# Patient Record
Sex: Female | Born: 1955 | Race: White | Hispanic: No | Marital: Married | Smoking: Former smoker
Health system: Southern US, Community
[De-identification: ages and names within clinical notes are randomized; demographics above are authoritative.]

## PROBLEM LIST (undated history)

## (undated) DIAGNOSIS — R1310 Dysphagia, unspecified: Secondary | ICD-10-CM

## (undated) DIAGNOSIS — F32A Depression, unspecified: Secondary | ICD-10-CM

## (undated) DIAGNOSIS — R202 Paresthesia of skin: Secondary | ICD-10-CM

## (undated) DIAGNOSIS — T884XXA Failed or difficult intubation, initial encounter: Secondary | ICD-10-CM

## (undated) DIAGNOSIS — K219 Gastro-esophageal reflux disease without esophagitis: Secondary | ICD-10-CM

## (undated) DIAGNOSIS — R112 Nausea with vomiting, unspecified: Secondary | ICD-10-CM

## (undated) DIAGNOSIS — R29898 Other symptoms and signs involving the musculoskeletal system: Secondary | ICD-10-CM

## (undated) DIAGNOSIS — I059 Rheumatic mitral valve disease, unspecified: Secondary | ICD-10-CM

## (undated) DIAGNOSIS — R519 Headache, unspecified: Secondary | ICD-10-CM

## (undated) DIAGNOSIS — M1990 Unspecified osteoarthritis, unspecified site: Secondary | ICD-10-CM

## (undated) DIAGNOSIS — R079 Chest pain, unspecified: Secondary | ICD-10-CM

## (undated) DIAGNOSIS — R011 Cardiac murmur, unspecified: Secondary | ICD-10-CM

## (undated) DIAGNOSIS — Z8489 Family history of other specified conditions: Secondary | ICD-10-CM

## (undated) DIAGNOSIS — G589 Mononeuropathy, unspecified: Secondary | ICD-10-CM

## (undated) DIAGNOSIS — R002 Palpitations: Secondary | ICD-10-CM

## (undated) DIAGNOSIS — F329 Major depressive disorder, single episode, unspecified: Secondary | ICD-10-CM

## (undated) DIAGNOSIS — K3184 Gastroparesis: Secondary | ICD-10-CM

## (undated) DIAGNOSIS — F419 Anxiety disorder, unspecified: Secondary | ICD-10-CM

## (undated) DIAGNOSIS — Z9889 Other specified postprocedural states: Secondary | ICD-10-CM

## (undated) DIAGNOSIS — M797 Fibromyalgia: Secondary | ICD-10-CM

## (undated) DIAGNOSIS — G43909 Migraine, unspecified, not intractable, without status migrainosus: Secondary | ICD-10-CM

## (undated) DIAGNOSIS — T753XXA Motion sickness, initial encounter: Secondary | ICD-10-CM

## (undated) DIAGNOSIS — M7580 Other shoulder lesions, unspecified shoulder: Secondary | ICD-10-CM

## (undated) DIAGNOSIS — G709 Myoneural disorder, unspecified: Secondary | ICD-10-CM

## (undated) DIAGNOSIS — R51 Headache: Secondary | ICD-10-CM

## (undated) DIAGNOSIS — S149XXA Injury of unspecified nerves of neck, initial encounter: Secondary | ICD-10-CM

## (undated) DIAGNOSIS — E785 Hyperlipidemia, unspecified: Secondary | ICD-10-CM

## (undated) DIAGNOSIS — Z8673 Personal history of transient ischemic attack (TIA), and cerebral infarction without residual deficits: Secondary | ICD-10-CM

## (undated) DIAGNOSIS — Q064 Hydromyelia: Secondary | ICD-10-CM

## (undated) DIAGNOSIS — K589 Irritable bowel syndrome without diarrhea: Secondary | ICD-10-CM

## (undated) DIAGNOSIS — R0602 Shortness of breath: Secondary | ICD-10-CM

## (undated) HISTORY — PX: CARDIOVASCULAR STRESS TEST: SHX262

## (undated) HISTORY — PX: ABDOMINAL HYSTERECTOMY: SHX81

## (undated) HISTORY — DX: Major depressive disorder, single episode, unspecified: F32.9

## (undated) HISTORY — DX: Depression, unspecified: F32.A

## (undated) HISTORY — DX: Other shoulder lesions, unspecified shoulder: M75.80

## (undated) HISTORY — DX: Myoneural disorder, unspecified: G70.9

## (undated) HISTORY — DX: Injury of unspecified nerves of neck, initial encounter: S14.9XXA

## (undated) HISTORY — PX: COLONOSCOPY WITH ESOPHAGOGASTRODUODENOSCOPY (EGD): SHX5779

## (undated) HISTORY — DX: Mononeuropathy, unspecified: G58.9

## (undated) HISTORY — PX: ECTOPIC PREGNANCY SURGERY: SHX613

## (undated) HISTORY — DX: Hyperlipidemia, unspecified: E78.5

---

## 2004-03-03 DIAGNOSIS — T884XXA Failed or difficult intubation, initial encounter: Secondary | ICD-10-CM

## 2004-03-03 HISTORY — DX: Failed or difficult intubation, initial encounter: T88.4XXA

## 2005-04-30 ENCOUNTER — Ambulatory Visit: Payer: Self-pay

## 2006-08-25 ENCOUNTER — Emergency Department: Payer: Self-pay | Admitting: Emergency Medicine

## 2006-08-25 ENCOUNTER — Other Ambulatory Visit: Payer: Self-pay

## 2007-03-02 ENCOUNTER — Ambulatory Visit: Payer: Self-pay

## 2008-03-03 DIAGNOSIS — Z8673 Personal history of transient ischemic attack (TIA), and cerebral infarction without residual deficits: Secondary | ICD-10-CM

## 2008-03-03 HISTORY — DX: Personal history of transient ischemic attack (TIA), and cerebral infarction without residual deficits: Z86.73

## 2010-02-20 ENCOUNTER — Ambulatory Visit: Payer: Self-pay

## 2010-07-30 ENCOUNTER — Emergency Department: Payer: Self-pay | Admitting: Emergency Medicine

## 2011-06-18 ENCOUNTER — Ambulatory Visit: Payer: Self-pay | Admitting: Rheumatology

## 2011-06-23 ENCOUNTER — Other Ambulatory Visit: Payer: Self-pay | Admitting: Family Medicine

## 2011-06-23 DIAGNOSIS — G459 Transient cerebral ischemic attack, unspecified: Secondary | ICD-10-CM

## 2011-06-23 DIAGNOSIS — M545 Low back pain: Secondary | ICD-10-CM

## 2011-06-25 ENCOUNTER — Other Ambulatory Visit: Payer: Self-pay | Admitting: Family Medicine

## 2011-06-25 DIAGNOSIS — R202 Paresthesia of skin: Secondary | ICD-10-CM

## 2011-06-25 DIAGNOSIS — G459 Transient cerebral ischemic attack, unspecified: Secondary | ICD-10-CM

## 2011-06-25 DIAGNOSIS — M25511 Pain in right shoulder: Secondary | ICD-10-CM

## 2011-06-25 DIAGNOSIS — M545 Low back pain: Secondary | ICD-10-CM

## 2011-06-26 ENCOUNTER — Other Ambulatory Visit: Payer: Self-pay | Admitting: Family Medicine

## 2011-06-26 DIAGNOSIS — M25559 Pain in unspecified hip: Secondary | ICD-10-CM

## 2011-06-26 DIAGNOSIS — M545 Low back pain: Secondary | ICD-10-CM

## 2011-06-26 DIAGNOSIS — G459 Transient cerebral ischemic attack, unspecified: Secondary | ICD-10-CM

## 2011-06-26 DIAGNOSIS — M25519 Pain in unspecified shoulder: Secondary | ICD-10-CM

## 2011-06-27 ENCOUNTER — Ambulatory Visit
Admission: RE | Admit: 2011-06-27 | Discharge: 2011-06-27 | Disposition: A | Discharge status: Home / Self Care | Payer: Medicaid Other | Source: Ambulatory Visit | Attending: Family Medicine | Admitting: Family Medicine

## 2011-06-27 DIAGNOSIS — G459 Transient cerebral ischemic attack, unspecified: Secondary | ICD-10-CM

## 2011-06-27 DIAGNOSIS — M545 Low back pain: Secondary | ICD-10-CM

## 2011-06-27 MED ORDER — GADOBENATE DIMEGLUMINE 529 MG/ML IV SOLN: 9.0000 mL | Freq: Once | INTRAVENOUS | Status: AC | PRN

## 2011-07-07 ENCOUNTER — Ambulatory Visit
Admission: RE | Admit: 2011-07-07 | Discharge: 2011-07-07 | Disposition: A | Discharge status: Home / Self Care | Payer: Medicaid Other | Source: Ambulatory Visit | Attending: Family Medicine | Admitting: Family Medicine

## 2011-07-07 DIAGNOSIS — G459 Transient cerebral ischemic attack, unspecified: Secondary | ICD-10-CM

## 2011-07-07 DIAGNOSIS — M25519 Pain in unspecified shoulder: Secondary | ICD-10-CM

## 2011-07-07 DIAGNOSIS — R202 Paresthesia of skin: Secondary | ICD-10-CM

## 2011-07-07 DIAGNOSIS — M545 Low back pain: Secondary | ICD-10-CM

## 2011-07-07 DIAGNOSIS — M25559 Pain in unspecified hip: Secondary | ICD-10-CM

## 2011-07-07 DIAGNOSIS — M25511 Pain in right shoulder: Secondary | ICD-10-CM

## 2011-07-07 MED ORDER — GADOBENATE DIMEGLUMINE 529 MG/ML IV SOLN
9.0000 mL | Freq: Once | INTRAVENOUS | Status: AC | PRN
  Administered 2011-07-07: 9 mL via INTRAVENOUS

## 2011-09-10 ENCOUNTER — Encounter: Payer: Self-pay | Admitting: Physical Medicine and Rehabilitation

## 2011-09-26 ENCOUNTER — Encounter: Payer: Self-pay | Admitting: Physical Medicine and Rehabilitation

## 2011-09-26 ENCOUNTER — Encounter
Payer: Medicaid Other | Attending: Physical Medicine and Rehabilitation | Admitting: Physical Medicine and Rehabilitation

## 2011-09-26 VITALS — BP 101/69 | HR 87 | Resp 16 | Ht 62.0 in | Wt 109.0 lb

## 2011-09-26 DIAGNOSIS — M797 Fibromyalgia: Secondary | ICD-10-CM

## 2011-09-26 DIAGNOSIS — IMO0001 Reserved for inherently not codable concepts without codable children: Secondary | ICD-10-CM | POA: Insufficient documentation

## 2011-09-26 DIAGNOSIS — R269 Unspecified abnormalities of gait and mobility: Secondary | ICD-10-CM | POA: Insufficient documentation

## 2011-09-26 DIAGNOSIS — M79605 Pain in left leg: Secondary | ICD-10-CM

## 2011-09-26 DIAGNOSIS — M7062 Trochanteric bursitis, left hip: Secondary | ICD-10-CM

## 2011-09-26 DIAGNOSIS — M5137 Other intervertebral disc degeneration, lumbosacral region: Secondary | ICD-10-CM | POA: Insufficient documentation

## 2011-09-26 DIAGNOSIS — M25529 Pain in unspecified elbow: Secondary | ICD-10-CM | POA: Insufficient documentation

## 2011-09-26 DIAGNOSIS — M545 Low back pain, unspecified: Secondary | ICD-10-CM | POA: Insufficient documentation

## 2011-09-26 DIAGNOSIS — E785 Hyperlipidemia, unspecified: Secondary | ICD-10-CM | POA: Insufficient documentation

## 2011-09-26 DIAGNOSIS — M542 Cervicalgia: Secondary | ICD-10-CM | POA: Insufficient documentation

## 2011-09-26 DIAGNOSIS — M629 Disorder of muscle, unspecified: Secondary | ICD-10-CM | POA: Insufficient documentation

## 2011-09-26 DIAGNOSIS — M76899 Other specified enthesopathies of unspecified lower limb, excluding foot: Secondary | ICD-10-CM | POA: Insufficient documentation

## 2011-09-26 DIAGNOSIS — M25519 Pain in unspecified shoulder: Secondary | ICD-10-CM | POA: Insufficient documentation

## 2011-09-26 DIAGNOSIS — M51379 Other intervertebral disc degeneration, lumbosacral region without mention of lumbar back pain or lower extremity pain: Secondary | ICD-10-CM | POA: Insufficient documentation

## 2011-09-26 DIAGNOSIS — M2420 Disorder of ligament, unspecified site: Secondary | ICD-10-CM | POA: Insufficient documentation

## 2011-09-26 DIAGNOSIS — M79609 Pain in unspecified limb: Secondary | ICD-10-CM

## 2011-09-26 DIAGNOSIS — M5030 Other cervical disc degeneration, unspecified cervical region: Secondary | ICD-10-CM | POA: Insufficient documentation

## 2011-09-26 DIAGNOSIS — M7072 Other bursitis of hip, left hip: Secondary | ICD-10-CM

## 2011-09-26 DIAGNOSIS — Q064 Hydromyelia: Secondary | ICD-10-CM

## 2011-09-26 DIAGNOSIS — M25569 Pain in unspecified knee: Secondary | ICD-10-CM | POA: Insufficient documentation
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
## 2011-09-26 NOTE — Patient Instructions (Signed)
I am ordering a soft cervical collar for your neck for you to wear on a as-needed basis.  I am ordering physical therapy to help you with understanding proper body mechanics and posture.  I'm ordering physical therapy to address your tight hamstrings and trochanter bursitis on the left hip.  We can consider in interjecting this left hip to help with pain relief in the upcoming visits.  I am waiting for UDS  I agree with continuing Lyrica.  Consider neurosurgery to evaluate cysts in your neck and back.

## 2011-10-13 ENCOUNTER — Encounter: Payer: Self-pay | Admitting: Physical Medicine and Rehabilitation

## 2011-10-13 ENCOUNTER — Ambulatory Visit
Payer: Medicaid Other | Attending: Physical Medicine and Rehabilitation | Admitting: Rehabilitative and Restorative Service Providers"

## 2011-10-13 ENCOUNTER — Encounter
Payer: Medicaid Other | Attending: Physical Medicine and Rehabilitation | Admitting: Physical Medicine and Rehabilitation

## 2011-10-13 VITALS — BP 110/73 | HR 83 | Resp 14 | Ht 62.0 in | Wt 109.0 lb

## 2011-10-13 DIAGNOSIS — M25569 Pain in unspecified knee: Secondary | ICD-10-CM | POA: Insufficient documentation

## 2011-10-13 DIAGNOSIS — M25559 Pain in unspecified hip: Secondary | ICD-10-CM | POA: Insufficient documentation

## 2011-10-13 DIAGNOSIS — M25511 Pain in right shoulder: Secondary | ICD-10-CM

## 2011-10-13 DIAGNOSIS — E785 Hyperlipidemia, unspecified: Secondary | ICD-10-CM | POA: Insufficient documentation

## 2011-10-13 DIAGNOSIS — M545 Low back pain, unspecified: Secondary | ICD-10-CM

## 2011-10-13 DIAGNOSIS — M2420 Disorder of ligament, unspecified site: Secondary | ICD-10-CM | POA: Insufficient documentation

## 2011-10-13 DIAGNOSIS — M79609 Pain in unspecified limb: Secondary | ICD-10-CM | POA: Insufficient documentation

## 2011-10-13 DIAGNOSIS — IMO0001 Reserved for inherently not codable concepts without codable children: Secondary | ICD-10-CM | POA: Insufficient documentation

## 2011-10-13 DIAGNOSIS — M7072 Other bursitis of hip, left hip: Secondary | ICD-10-CM

## 2011-10-13 DIAGNOSIS — M542 Cervicalgia: Secondary | ICD-10-CM

## 2011-10-13 DIAGNOSIS — R269 Unspecified abnormalities of gait and mobility: Secondary | ICD-10-CM | POA: Insufficient documentation

## 2011-10-13 DIAGNOSIS — M76899 Other specified enthesopathies of unspecified lower limb, excluding foot: Secondary | ICD-10-CM

## 2011-10-13 DIAGNOSIS — M5137 Other intervertebral disc degeneration, lumbosacral region: Secondary | ICD-10-CM | POA: Insufficient documentation

## 2011-10-13 DIAGNOSIS — M51379 Other intervertebral disc degeneration, lumbosacral region without mention of lumbar back pain or lower extremity pain: Secondary | ICD-10-CM | POA: Insufficient documentation

## 2011-10-13 DIAGNOSIS — M25519 Pain in unspecified shoulder: Secondary | ICD-10-CM

## 2011-10-13 DIAGNOSIS — M629 Disorder of muscle, unspecified: Secondary | ICD-10-CM | POA: Insufficient documentation

## 2011-10-13 DIAGNOSIS — M25529 Pain in unspecified elbow: Secondary | ICD-10-CM | POA: Insufficient documentation

## 2011-10-13 DIAGNOSIS — M5030 Other cervical disc degeneration, unspecified cervical region: Secondary | ICD-10-CM | POA: Insufficient documentation

## 2011-10-13 DIAGNOSIS — Q064 Hydromyelia: Secondary | ICD-10-CM | POA: Insufficient documentation
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
## 2011-10-13 NOTE — Patient Instructions (Addendum)
Today we discussed your various pain complaints.  You are in physical therapy to address your left hip pain.  I also understand your right shoulder is bothering you end we are setting you up for a right shoulder injection.  You also mentioned you have some numbness and tingling in both hands and I would like to do nerve conduction studies to check for carpal, syndrome.  I discussed MRI findings in your neck and back region. I have mentioned I would like you to followup with neurosurgery to monitor this.  They may also be able to comment on your tarlov cysts.  You mentioned you've had bilateral shoulder x-rays and will bring me a copy of the previous report from these x-rays

## 2011-10-17 ENCOUNTER — Ambulatory Visit: Payer: Medicaid Other | Admitting: Physical Medicine and Rehabilitation

## 2011-10-23 ENCOUNTER — Encounter: Payer: Medicaid Other | Admitting: Physical Therapy

## 2011-10-30 ENCOUNTER — Encounter: Payer: Medicaid Other | Admitting: Physical Therapy

## 2011-10-31 ENCOUNTER — Ambulatory Visit: Payer: Medicaid Other | Admitting: Physical Medicine and Rehabilitation

## 2011-11-04 ENCOUNTER — Ambulatory Visit: Payer: Medicaid Other | Attending: Physical Medicine and Rehabilitation | Admitting: Physical Therapy

## 2011-11-04 DIAGNOSIS — IMO0001 Reserved for inherently not codable concepts without codable children: Secondary | ICD-10-CM | POA: Insufficient documentation

## 2011-11-04 DIAGNOSIS — M25559 Pain in unspecified hip: Secondary | ICD-10-CM | POA: Insufficient documentation

## 2011-11-04 DIAGNOSIS — M542 Cervicalgia: Secondary | ICD-10-CM | POA: Insufficient documentation

## 2011-11-04 DIAGNOSIS — M2560 Stiffness of unspecified joint, not elsewhere classified: Secondary | ICD-10-CM | POA: Insufficient documentation

## 2011-11-06 ENCOUNTER — Ambulatory Visit: Payer: Medicaid Other | Admitting: Physical Therapy

## 2011-11-10 ENCOUNTER — Ambulatory Visit: Payer: Medicaid Other | Admitting: Physical Medicine and Rehabilitation

## 2011-11-14 ENCOUNTER — Encounter: Payer: Self-pay | Admitting: Physical Medicine and Rehabilitation

## 2011-11-14 ENCOUNTER — Encounter
Payer: Medicaid Other | Attending: Physical Medicine and Rehabilitation | Admitting: Physical Medicine and Rehabilitation

## 2011-11-14 VITALS — BP 112/66 | HR 62 | Resp 16 | Ht 62.0 in | Wt 105.0 lb

## 2011-11-14 DIAGNOSIS — M25529 Pain in unspecified elbow: Secondary | ICD-10-CM | POA: Insufficient documentation

## 2011-11-14 DIAGNOSIS — M542 Cervicalgia: Secondary | ICD-10-CM | POA: Insufficient documentation

## 2011-11-14 DIAGNOSIS — M79609 Pain in unspecified limb: Secondary | ICD-10-CM | POA: Insufficient documentation

## 2011-11-14 DIAGNOSIS — Q064 Hydromyelia: Secondary | ICD-10-CM | POA: Insufficient documentation

## 2011-11-14 DIAGNOSIS — M2420 Disorder of ligament, unspecified site: Secondary | ICD-10-CM | POA: Insufficient documentation

## 2011-11-14 DIAGNOSIS — M25569 Pain in unspecified knee: Secondary | ICD-10-CM | POA: Insufficient documentation

## 2011-11-14 DIAGNOSIS — M545 Low back pain, unspecified: Secondary | ICD-10-CM | POA: Insufficient documentation

## 2011-11-14 DIAGNOSIS — M5030 Other cervical disc degeneration, unspecified cervical region: Secondary | ICD-10-CM | POA: Insufficient documentation

## 2011-11-14 DIAGNOSIS — R269 Unspecified abnormalities of gait and mobility: Secondary | ICD-10-CM | POA: Insufficient documentation

## 2011-11-14 DIAGNOSIS — M5137 Other intervertebral disc degeneration, lumbosacral region: Secondary | ICD-10-CM | POA: Insufficient documentation

## 2011-11-14 DIAGNOSIS — M51379 Other intervertebral disc degeneration, lumbosacral region without mention of lumbar back pain or lower extremity pain: Secondary | ICD-10-CM | POA: Insufficient documentation

## 2011-11-14 DIAGNOSIS — M629 Disorder of muscle, unspecified: Secondary | ICD-10-CM | POA: Insufficient documentation

## 2011-11-14 DIAGNOSIS — M25511 Pain in right shoulder: Secondary | ICD-10-CM

## 2011-11-14 DIAGNOSIS — M76899 Other specified enthesopathies of unspecified lower limb, excluding foot: Secondary | ICD-10-CM | POA: Insufficient documentation

## 2011-11-14 DIAGNOSIS — E785 Hyperlipidemia, unspecified: Secondary | ICD-10-CM | POA: Insufficient documentation

## 2011-11-14 DIAGNOSIS — M25519 Pain in unspecified shoulder: Secondary | ICD-10-CM | POA: Insufficient documentation

## 2011-11-14 DIAGNOSIS — IMO0001 Reserved for inherently not codable concepts without codable children: Secondary | ICD-10-CM | POA: Insufficient documentation

## 2011-11-14 NOTE — Patient Instructions (Addendum)
F/u one month       Joint Injection Care After Refer to this sheet in the next few days. These instructions provide you with information on caring for yourself after you have had a joint injection. Your caregiver also may give you more specific instructions. Your treatment has been planned according to current medical practices, but problems sometimes occur. Call your caregiver if you have any problems or questions after your procedure. After any type of joint injection, it is not uncommon to experience:  Soreness, swelling, or bruising around the injection site.   Mild numbness, tingling, or weakness around the injection site caused by the numbing medicine used before or with the injection.  It also is possible to experience the following effects associated with the specific agent after injection:  Iodine-based contrast agents:   Allergic reaction (itching, hives, widespread redness, and swelling beyond the injection site).   Corticosteroids (These effects are rare.):   Allergic reaction.   Increased blood sugar levels (If you have diabetes and you notice that your blood sugar levels have increased, notify your caregiver).   Increased blood pressure levels.   Mood swings.   Hyaluronic acid in the use of viscosupplementation.   Temporary heat or redness.   Temporary rash and itching.   Increased fluid accumulation in the injected joint.  These effects all should resolve within a day after your procedure.  HOME CARE INSTRUCTIONS  Limit yourself to light activity the day of your procedure. Avoid lifting heavy objects, bending, stooping, or twisting.   Take prescription or over-the-counter pain medication as directed by your caregiver.   You may apply ice to your injection site to reduce pain and swelling the day of your procedure. Ice may be applied 3 to 4 times:   Put ice in a plastic bag.   Place a towel between your skin and the bag.   Leave the ice on for no  longer than 15 to 20 minutes each time.  SEEK IMMEDIATE MEDICAL CARE IF:   Pain and swelling get worse rather than better or extend beyond the injection site.   Numbness does not go away.   Blood or fluid continues to leak from the injection site.   You have chest pain.   You have swelling of your face or tongue.   You have trouble breathing or you become dizzy.   You develop a fever, chills, or severe tenderness at the injection site that last longer than 1 day.  MAKE SURE YOU:  Understand these instructions.   Watch your condition.   Get help right away if you are not doing well or if you get worse.  Document Released: 10/31/2010 Document Revised: 02/06/2011 Document Reviewed: 10/31/2010 [facility] Patient Information 2012 [place], [place].

## 2011-11-14 NOTE — Progress Notes (Signed)
  Right Shoulder Pain not improved with conservative management  She  has limited range of motion in the right shoulder consistent with possible rotator cuff tendinitis. She tells me she also has known shoulder OA. She would like to be set up for right shoulder injection.    Right Shoulder injection with Ultrasound Guidance   Indication:Right Shoulder pain not relieved by medication management and other conservative care.  Informed consent was obtained after describing risks and benefits of the procedure with the patient, this includes bleeding, bruising, infection and medication side effects. The patient wishes to proceed and has given written consent. Patient was placed in a seated position. The right shoulder was marked and prepped with betadine in the subacromial area. A 25-gauge 1-1/2 inch needle was inserted into the subacromial area. After negative draw back for blood, a solution containing 1 mL of 40 mg per ML depomedrol and 3 mL of 1% lidocaine was injected. A band aid was applied. The patient tolerated the procedure well. Post procedure instructions were given.

## 2011-11-17 ENCOUNTER — Telehealth: Payer: Self-pay | Admitting: Physical Medicine and Rehabilitation

## 2011-11-17 NOTE — Telephone Encounter (Signed)
Did you want to refer this patient?

## 2011-11-17 NOTE — Telephone Encounter (Signed)
Patient following up on referral to [facility]

## 2011-11-19 ENCOUNTER — Other Ambulatory Visit: Payer: Self-pay | Admitting: Physical Medicine and Rehabilitation

## 2011-11-19 DIAGNOSIS — Q064 Hydromyelia: Secondary | ICD-10-CM

## 2011-11-19 DIAGNOSIS — M542 Cervicalgia: Secondary | ICD-10-CM

## 2011-12-12 ENCOUNTER — Ambulatory Visit: Payer: Medicaid Other | Admitting: Physical Medicine and Rehabilitation

## 2011-12-17 ENCOUNTER — Encounter: Payer: Medicaid Other | Admitting: Physical Medicine and Rehabilitation

## 2012-01-14 ENCOUNTER — Ambulatory Visit: Payer: Medicaid Other | Admitting: Physical Medicine and Rehabilitation

## 2012-02-11 ENCOUNTER — Encounter: Payer: Self-pay | Admitting: Physical Medicine and Rehabilitation

## 2012-02-11 ENCOUNTER — Ambulatory Visit
Admission: RE | Admit: 2012-02-11 | Discharge: 2012-02-11 | Disposition: A | Discharge status: Home / Self Care | Payer: Medicaid Other | Source: Ambulatory Visit | Attending: Physical Medicine and Rehabilitation | Admitting: Physical Medicine and Rehabilitation

## 2012-02-11 ENCOUNTER — Other Ambulatory Visit: Payer: Self-pay | Admitting: Physical Medicine and Rehabilitation

## 2012-02-11 ENCOUNTER — Encounter
Payer: Medicaid Other | Attending: Physical Medicine and Rehabilitation | Admitting: Physical Medicine and Rehabilitation

## 2012-02-11 VITALS — BP 103/63 | HR 79 | Resp 14 | Ht 62.0 in | Wt 115.2 lb

## 2012-02-11 DIAGNOSIS — M25562 Pain in left knee: Secondary | ICD-10-CM

## 2012-02-11 DIAGNOSIS — M25552 Pain in left hip: Secondary | ICD-10-CM

## 2012-02-11 DIAGNOSIS — G8929 Other chronic pain: Secondary | ICD-10-CM

## 2012-02-11 DIAGNOSIS — M25569 Pain in unspecified knee: Secondary | ICD-10-CM | POA: Insufficient documentation

## 2012-02-11 DIAGNOSIS — M25559 Pain in unspecified hip: Secondary | ICD-10-CM | POA: Insufficient documentation
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
## 2012-02-11 NOTE — Patient Instructions (Signed)
Today I am ordering x-rays of your left hip and left knee.  I'm glad your right shoulder has improved somewhat after the injection.  I would like you to continue to do wall walking exercises as well as cotton pendulum exercises.  I will see you back in a month  Shoulder Range of Motion Exercises The shoulder is the most flexible joint in the human body. Because of this it is also the most unstable joint in the body. All ages can develop shoulder problems. Early treatment of problems is necessary for a good outcome. People react to shoulder pain by decreasing the movement of the joint. After a brief period of time, the shoulder can become "frozen". This is an almost complete loss of the ability to move the damaged shoulder. Following injuries your caregivers can give you instructions on exercises to keep your range of motion (ability to move your shoulder freely), or regain it if it has been lost.  EXERCISES EXERCISES TO MAINTAIN THE MOBILITY OF YOUR SHOULDER: Codman's Exercise or Pendulum Exercise  This exercise may be performed in a prone (face-down) lying position or standing while leaning on a chair with the opposite arm. Its purpose is to relax the muscles in your shoulder and slowly but surely increase the range of motion and to relieve pain.  Lie on your stomach close to the side edge of the bed. Let your weak arm hang over the edge of the bed. Relax your shoulder, arm and hand. Let your shoulder blade relax and drop down.  Slowly and gently swing your arm forward and back. Do not use your neck muscles; relax them. It might be easier to have someone else gently start swinging your arm.  As pain decreases, increase your swing. To start, arm swing should begin at 15 degree angles. In time and as pain lessens, move to 30-45 degree angles. Start with swinging for about 15 seconds, and work towards swinging for 3 to 5 minutes.  This exercise may also be performed in a standing/bent over  position.  Stand and hold onto a sturdy chair with your good arm. Bend forward at the waist and bend your knees slightly to help protect your back. Relax your weak arm, let it hang limp. Relax your shoulder blade and let it drop.  Keep your shoulder relaxed and use body motion to swing your arm in small circles.  Stand up tall and relax.  Repeat motion and change direction of circles.  Start with swinging for about 30 seconds, and work towards swinging for 3 to 5 minutes. STRETCHING EXERCISES:  Lift your arm out in front of you with the elbow bent at 90 degrees. Using your other arm gently pull the elbow forward and across your body.  Bend one arm behind you with the palm facing outward. Using the other arm, hold a towel or rope and reach this arm up above your head, then bend it at the elbow to move your wrist to behind your neck. Grab the free end of the towel with the hand behind your back. Gently pull the towel up with the hand behind your neck, gradually increasing the pull on the hand behind the small of your back. Then, gradually pull down with the hand behind the small of your back. This will pull the hand and arm behind your neck further. Both shoulders will have an increased range of motion with repetition of this exercise. STRENGTHENING EXERCISES:  Standing with your arm at your side and  straight out from your shoulder with the elbow bent at 90 degrees, hold onto a small weight and slowly raise your hand so it points straight up in the air. Repeat this five times to begin with, and gradually increase to ten times. Do this four times per day. As you grow stronger you can gradually increase the weight.  Repeat the above exercise, only this time using an elastic band. Start with your hand up in the air and pull down until your hand is by your side. As you grow stronger, gradually increase the amount you pull by increasing the number or size of the elastic bands. Use the same amount of  repetitions.  Standing with your hand at your side and holding onto a weight, gradually lift the hand in front of you until it is over your head. Do the same also with the hand remaining at your side and lift the hand away from your body until it is again over your head. Repeat this five times to begin with, and gradually increase to ten times. Do this four times per day. As you grow stronger you can gradually increase the weight. Document Released: 11/16/2002 Document Revised: 05/12/2011 Document Reviewed: 02/17/2005 [facility] Patient Information 2013 [place], [place].

## 2012-02-23 ENCOUNTER — Ambulatory Visit: Payer: Self-pay | Admitting: Family Medicine

## 2012-03-24 ENCOUNTER — Encounter: Payer: Medicaid Other | Admitting: Physical Medicine and Rehabilitation

## 2012-03-31 ENCOUNTER — Encounter: Payer: Medicaid Other | Admitting: Physical Medicine and Rehabilitation

## 2012-04-16 ENCOUNTER — Encounter: Payer: Medicaid Other | Admitting: Physical Medicine and Rehabilitation

## 2012-04-20 ENCOUNTER — Encounter: Payer: Self-pay | Admitting: Physical Medicine and Rehabilitation

## 2012-04-20 ENCOUNTER — Encounter
Payer: Medicaid Other | Attending: Physical Medicine and Rehabilitation | Admitting: Physical Medicine and Rehabilitation

## 2012-04-20 VITALS — BP 116/68 | HR 72 | Resp 14 | Ht 63.0 in | Wt 116.0 lb

## 2012-04-20 DIAGNOSIS — E785 Hyperlipidemia, unspecified: Secondary | ICD-10-CM | POA: Insufficient documentation

## 2012-04-20 DIAGNOSIS — N329 Bladder disorder, unspecified: Secondary | ICD-10-CM | POA: Insufficient documentation

## 2012-04-20 DIAGNOSIS — R52 Pain, unspecified: Secondary | ICD-10-CM | POA: Insufficient documentation

## 2012-04-20 DIAGNOSIS — M792 Neuralgia and neuritis, unspecified: Secondary | ICD-10-CM

## 2012-04-20 DIAGNOSIS — G4700 Insomnia, unspecified: Secondary | ICD-10-CM | POA: Insufficient documentation

## 2012-04-20 DIAGNOSIS — M7701 Medial epicondylitis, right elbow: Secondary | ICD-10-CM | POA: Insufficient documentation

## 2012-04-20 DIAGNOSIS — M7072 Other bursitis of hip, left hip: Secondary | ICD-10-CM

## 2012-04-20 DIAGNOSIS — G589 Mononeuropathy, unspecified: Secondary | ICD-10-CM | POA: Insufficient documentation

## 2012-04-20 DIAGNOSIS — G950 Syringomyelia and syringobulbia: Secondary | ICD-10-CM | POA: Insufficient documentation

## 2012-04-20 DIAGNOSIS — M76899 Other specified enthesopathies of unspecified lower limb, excluding foot: Secondary | ICD-10-CM | POA: Insufficient documentation

## 2012-04-20 DIAGNOSIS — IMO0001 Reserved for inherently not codable concepts without codable children: Secondary | ICD-10-CM | POA: Insufficient documentation

## 2012-04-20 DIAGNOSIS — M797 Fibromyalgia: Secondary | ICD-10-CM | POA: Insufficient documentation

## 2012-04-20 DIAGNOSIS — M7702 Medial epicondylitis, left elbow: Secondary | ICD-10-CM | POA: Insufficient documentation

## 2012-04-20 DIAGNOSIS — M7700 Medial epicondylitis, unspecified elbow: Secondary | ICD-10-CM

## 2012-04-20 DIAGNOSIS — IMO0002 Reserved for concepts with insufficient information to code with codable children: Secondary | ICD-10-CM

## 2012-04-20 DIAGNOSIS — M25559 Pain in unspecified hip: Secondary | ICD-10-CM

## 2012-04-20 DIAGNOSIS — G8929 Other chronic pain: Secondary | ICD-10-CM

## 2012-04-20 MED ORDER — DICLOFENAC SODIUM 1 % TD GEL: 2.0000 g | Freq: Four times a day (QID) | TRANSDERMAL | Status: DC

## 2012-04-20 NOTE — Progress Notes (Signed)
Subjective:    Patient ID: Carmen Deleon, female    DOB: May 27, 1955, 57 y.o.   MRN: [id number]  HPI The patient is a 57 year old woman with multiple pain complaints. She has a known cervical/thoracic syrinx and has seen neurosurgery who is currently following. She has a long history of fibromyalgia. She has Tarlov cysts. She is chronic shoulder pain/rotator cuff problems.  Her chief complaint today is multiple and includes new bilateral heel elbow pain. Chronic ongoing left hip thigh pain (since about 2006). Insomnia. Occasional bladder problems.  Recent hip and knee radiographs are reviewed with her and show mild degenerative changes.  She reports she had recent bone density and mammogram.  She maintains contact with primary care.   Pain Inventory Average Pain 9 Pain Right Now 9 My pain is constant, sharp, burning, dull, stabbing, tingling and aching  In the last 24 hours, has pain interfered with the following? General activity 9 Relation with others 9 Enjoyment of life 9 What TIME of day is your pain at its worst? morning, evening and night Sleep (in general) Poor  Pain is worse with: walking, bending, sitting and standing Pain improves with: rest, heat/ice and medication Relief from Meds: 5  Mobility ability to climb steps?  yes do you drive?  yes  Function disabled: date disabled 2007 I need assistance with the following:  meal prep, household duties and shopping  Neuro/Psych bladder control problems bowel control problems weakness numbness tingling trouble walking spasms depression anxiety  Prior Studies Any changes since last visit?  no  Physicians involved in your care Any changes since last visit?  no   Family History  Problem Relation Age of Onset  . Stroke Mother   . Heart disease Mother   . Cancer Mother   . Hypertension Mother   . Arthritis Mother   . Depression Mother   . Cancer Father   . Arthritis Father   . Depression Father     History   Social History  . Marital Status: Married    Spouse Name: N/A    Number of Children: N/A  . Years of Education: N/A   Social History Main Topics  . Smoking status: Former Games developer  . Smokeless tobacco: Never Used  . Alcohol Use: No  . Drug Use: None  . Sexually Active: None   Other Topics Concern  . None   Social History Narrative  . None   Past Surgical History  Procedure Laterality Date  . Abdominal hysterectomy     Past Medical History  Diagnosis Date  . Depression   . Hyperlipidemia   . Neuromuscular disorder   . AC (acromioclavicular) joint bone spurs   . Pinched nerve in neck    BP 116/68  Pulse 72  Resp 14  Ht 5\' 3"  (1.6 m)  Wt 116 lb (52.617 kg)  BMI 20.55 kg/m2  SpO2 100%     Review of Systems  HENT: Positive for neck pain.   Gastrointestinal: Positive for nausea, vomiting, diarrhea and constipation.  Musculoskeletal: Positive for back pain and gait problem.  Neurological: Positive for weakness and numbness.  Psychiatric/Behavioral: Positive for dysphoric mood. The patient is nervous/anxious.   All other systems reviewed and are negative.       Objective:   Physical Exam  The patient is a well-developed, thin woman who is in no apparent distress.  She is oriented x3 her speech is clear, affect initially depressed however as we talked she did laugh and brighten up.  Cranial nerves are grossly intact  Reflexes are 2+ in the upper extremities biceps triceps brachioradialis  Reflexes are 2+ in the lower extremities patellar tendon and Achilles tendon  No abnormal tone clonus or tremors are noted  (Patient reports taking Valium as well as baclofen earlier in the day)  Reports slightly decreased sensation left lower extremity compared to right to light touch  Manual muscle testing reveals good strength in both upper and lower extremities without obvious focal deficit. Pain complaints are noted during manual muscle  testing.  Active Wrist flexion actively increases pain along the medial epicondyle bilaterally  Coordination is intact.  Toes are downgoing no clonus is noted  Transitions from sit to stand, antalgic gait noted  Decreased range of motion lumbar spine in all planes  Tenderness noted along left lateral hip  Tenderness also noted over bilateral medial epicondyle's, full range of motion at wrist noted full range of motion at elbow noted with flexion extension as well as supination and pronation  Pain in the medial epicondyle is aggravated with forced wrist flexion             Assessment & Plan:  1.  Bilateral medial epicondylitis:   Plan: Voltaren gel to each elbow 4 times a day, icing 10-15 minutes 3 times a day, stretches as directed  2.  Left trochanteric bursitis:  Ice to left trochanter several times a day as directed  Continue home stretching program  Consider left trochanter hip injection in the upcoming weeks if still problematic  3.  Insomnia: Educational material given on sleep hygiene today.  Recommend decreased caffeine intake after noon.  Discontinue caffeinated tea in the evening.  4. Bladder problems:   Consider urology consultation to eval cause.  5.  Diffuse pain: Etiology may be multifactorial she has a history of fibromyalgia as well as syringomyelia, and tarlov cysts, contributing rolling pain unclear. Maintain contact with neurosurgeon for monitoring syrinx.  Plan: Patient has tolerated Lyrica 50 mg 3 times a day, consider increasing to 75 mg 3 times a day. She would like to address this with her PCP as this medication is currently prescribed by PCP.  On tramadol but does not like to take it.  Makes her nauseated at times  F/u one month ORT 6

## 2012-04-20 NOTE — Patient Instructions (Addendum)
F/u one month  Considering increase in lyrica to 75 mg three times per day  Considering left hip injection  Maintain contact with primary care physician  neurosurgeon per his follow up request Consider urology consult for bladder problems  Medial Epicondylitis (Golfer's Elbow) with Rehab Medial epicondylitis involves inflammation and pain around the inner (medial) portion of the elbow. This pain is caused by inflammation of the tendons in the forearm that flex (bring down) the wrist. Medial epicondylitis is also called golfer's elbow, because it is common among golfers. However, it may occur in any individual who flexes the wrist regularly. If medial epicondylitis is left untreated, it may become a chronic problem. SYMPTOMS   Pain, tenderness, or inflammation over the inner (medial) side of the elbow.  Pain or weakness with gripping activities.  Pain that increases with wrist twisting motions (using a screwdriver, playing golf, bowling). CAUSES  Medial epicondylitis is caused by inflammation of the tendons that flex the wrist. Causes of injury may include:  Chronic, repetitive stress and strain to the tendons that run from the wrist and forearm to the elbow.  Sudden strain on the forearm, including wrist snap when serving balls with racquet sports, or throwing a baseball. RISK INCREASES WITH:  Sports or occupations that require repetitive and/or strenuous forearm and wrist movements (pitching a baseball, golfing, carpentry).  Poor wrist and forearm strength and flexibility.  Failure to warm up properly before activity.  Resuming activity before healing, rehabilitation, and conditioning are complete. PREVENTION   Warm up and stretch properly before activity.  Maintain physical fitness:  Strength, flexibility, and endurance.  Cardiovascular fitness.  Wear and use properly fitted equipment.  Learn and use proper technique and have a coach correct improper  technique.  Wear a tennis elbow (counterforce) brace. PROGNOSIS  The course of this condition depends on the degree of the injury. If treated properly, acute cases (symptoms lasting less than 4 weeks) are often resolved in 2 to 6 weeks. Chronic (longer lasting cases) often resolve in 3 to 6 months, but may require physical therapy. RELATED COMPLICATIONS   Frequently recurring symptoms, resulting in a chronic problem. Properly treating the problem the first time decreases frequency of recurrence.  Chronic inflammation, scarring, and partial tendon tear, requiring surgery.  Delayed healing or resolution of symptoms. TREATMENT  Treatment first involves the use of ice and medicine, to reduce pain and inflammation. Strengthening and stretching exercises may reduce discomfort, if performed regularly. These exercises may be performed at home, if the condition is an acute injury. Chronic cases may require a referral to a physical therapist for evaluation and treatment. Your caregiver may advise a corticosteroid injection to help reduce inflammation. Rarely, surgery is needed. MEDICATION  If pain medicine is needed, nonsteroidal anti-inflammatory medicines (aspirin and ibuprofen), or other minor pain relievers (acetaminophen), are often advised.  Do not take pain medicine for 7 days before surgery.  Prescription pain relievers may be given, if your caregiver thinks they are needed. Use only as directed and only as much as you need.  Corticosteroid injections may be recommended. These injections should be reserved only for the most severe cases, because they can only be given a certain number of times. HEAT AND COLD  Cold treatment (icing) should be applied for 10 to 15 minutes every 2 to 3 hours for inflammation and pain, and immediately after activity that aggravates your symptoms. Use ice packs or an ice massage.  Heat treatment may be used before performing stretching and  strengthening  activities prescribed by your caregiver, physical therapist, or athletic trainer. Use a heat pack or a warm water soak. SEEK MEDICAL CARE IF: Symptoms get worse or do not improve in 2 weeks, despite treatment. EXERCISES  RANGE OF MOTION (ROM) AND STRETCHING EXERCISES - Epicondylitis, Medial (Golfer's Elbow) These exercises may help you when beginning to rehabilitate your injury. Your symptoms may go away with or without further involvement from your physician, physical therapist or athletic trainer. While completing these exercises, remember:   Restoring tissue flexibility helps normal motion to return to the joints. This allows healthier, less painful movement and activity.  An effective stretch should be held for at least 30 seconds.  A stretch should never be painful. You should only feel a gentle lengthening or release in the stretched tissue. RANGE OF MOTION  Wrist Flexion, Active-Assisted  Extend your right / left elbow with your fingers pointing down.*  Gently pull the back of your hand towards you, until you feel a gentle stretch on the top of your forearm.  Hold this position for __________ seconds. Repeat __________ times. Complete this exercise __________ times per day.  *If directed by your physician, physical therapist or athletic trainer, complete this stretch with your elbow bent, rather than extended. RANGE OF MOTION  Wrist Extension, Active-Assisted  Extend your right / left elbow and turn your palm upwards.*  Gently pull your palm and fingertips back, so your wrist extends and your fingers point more toward the ground.  You should feel a gentle stretch on the inside of your forearm.  Hold this position for __________ seconds. Repeat __________ times. Complete this exercise __________ times per day. *If directed by your physician, physical therapist or athletic trainer, complete this stretch with your elbow bent, rather than extended. STRETCH  Wrist Extension    Place your right / left fingertips on a tabletop leaving your elbow slightly bent. Your fingers should point backwards.  Gently press your fingers and palm down onto the table, by straightening your elbow. You should feel a stretch on the inside of your forearm.  Hold this position for __________ seconds. Repeat __________ times. Complete this stretch __________ times per day.  STRENGTHENING EXERCISES - Epicondylitis, Medial (Golfer's Elbow) These exercises may help you when beginning to rehabilitate your injury. They may resolve your symptoms with or without further involvement from your physician, physical therapist or athletic trainer. While completing these exercises, remember:   Muscles can gain both the endurance and the strength needed for everyday activities through controlled exercises.  Complete these exercises as instructed by your physician, physical therapist or athletic trainer. Increase the resistance and repetitions only as guided.  You may experience muscle soreness or fatigue, but the pain or discomfort you are trying to eliminate should never worsen during these exercises. If this pain does get worse, stop and make sure you are following the directions exactly. If the pain is still present after adjustments, discontinue the exercise until you can discuss the trouble with your caregiver. STRENGTH Wrist Flexors  Sit with your right / left forearm palm-up, and fully supported on a table or countertop. Your elbow should be resting below the height of your shoulder. Allow your wrist to extend over the edge of the surface.  Loosely holding a __________ weight, or a piece of rubber exercise band or tubing, slowly curl your hand up toward your forearm.  Hold this position for __________ seconds. Slowly lower the wrist back to the starting position in a  controlled manner. Repeat __________ times. Complete this exercise __________ times per day.  STRENGTH  Wrist Extensors  Sit  with your right / left forearm palm-down and fully supported. Your elbow should be resting below the height of your shoulder. Allow your wrist to extend over the edge of the surface.  Loosely holding a __________ weight, or a piece of rubber exercise band or tubing, slowly curl your hand up toward your forearm.  Hold this position for __________ seconds. Slowly lower the wrist back to the starting position in a controlled manner. Repeat __________ times. Complete this exercise __________ times per day.  STRENGTH - Ulnar Deviators  Stand with a ____________________ weight in your right / left hand, or sit while holding a rubber exercise band or tubing, with your healthy arm supported on a table or countertop.  Move your wrist so that your pinkie travels toward your forearm and your thumb moves away from your forearm.  Hold this position for __________ seconds and then slowly lower the wrist back to the starting position. Repeat __________ times. Complete this exercise __________ times per day STRENGTH - Grip   Grasp a tennis ball, a dense sponge, or a large, rolled sock in your hand.  Squeeze as hard as you can, without increasing any pain.  Hold this position for __________ seconds. Release your grip slowly. Repeat __________ times. Complete this exercise __________ times per day.  STRENGTH  Forearm Supinators   Sit with your right / left forearm supported on a table, keeping your elbow below shoulder height. Rest your hand over the edge, palm down.  Gently grip a hammer or a soup ladle.  Without moving your elbow, slowly turn your palm and hand upward to a "thumbs-up" position.  Hold this position for __________ seconds. Slowly return to the starting position. Repeat __________ times. Complete this exercise __________ times per day.  STRENGTH  Forearm Pronators  Sit with your right / left forearm supported on a table, keeping your elbow below shoulder height. Rest your hand over  the edge, palm up.  Gently grip a hammer or a soup ladle.  Without moving your elbow, slowly turn your palm and hand upward to a "thumbs-up" position.  Hold this position for __________ seconds. Slowly return to the starting position. Repeat __________ times. Complete this exercise __________ times per day.  Document Released: 02/17/2005 Document Revised: 05/12/2011 Document Reviewed: 06/01/2008 [facility] Patient Information 2013 [place], [place].

## 2012-04-22 ENCOUNTER — Telehealth: Payer: Self-pay

## 2012-04-22 MED ORDER — DICLOFENAC SODIUM 1 % TD GEL: 2.0000 g | Freq: Four times a day (QID) | TRANSDERMAL | Status: DC

## 2012-04-22 NOTE — Telephone Encounter (Signed)
Patient called to say her volteran gel was not sent to the right pharmacy.  She would like it called into [facility] [phone].  This was called in.  Patient aware.

## 2012-05-12 ENCOUNTER — Encounter
Payer: Medicaid Other | Attending: Physical Medicine and Rehabilitation | Admitting: Physical Medicine and Rehabilitation

## 2012-05-12 ENCOUNTER — Encounter: Payer: Self-pay | Admitting: Physical Medicine and Rehabilitation

## 2012-05-12 VITALS — BP 113/59 | HR 70 | Resp 14 | Ht 63.0 in | Wt 115.4 lb

## 2012-05-12 DIAGNOSIS — M25559 Pain in unspecified hip: Secondary | ICD-10-CM | POA: Insufficient documentation

## 2012-05-12 DIAGNOSIS — M25569 Pain in unspecified knee: Secondary | ICD-10-CM | POA: Insufficient documentation

## 2012-05-12 DIAGNOSIS — M797 Fibromyalgia: Secondary | ICD-10-CM

## 2012-05-12 DIAGNOSIS — M542 Cervicalgia: Secondary | ICD-10-CM

## 2012-05-12 DIAGNOSIS — G8929 Other chronic pain: Secondary | ICD-10-CM | POA: Insufficient documentation

## 2012-05-12 DIAGNOSIS — IMO0001 Reserved for inherently not codable concepts without codable children: Secondary | ICD-10-CM

## 2012-05-12 NOTE — Progress Notes (Signed)
Subjective:    Patient ID: Carmen Deleon, female    DOB: 30-Apr-1955, 57 y.o.   MRN: [id number]  HPI   The patient is a 57 year old woman with multiple pain complaints. She has a known cervical/thoracic syrinx and has seen neurosurgery who is currently following. She has a long history of fibromyalgia. She has Tarlov cysts. She is chronic shoulder pain/rotator cuff problems.    Her chief complaint today is diffuse body pain. Chronic ongoing left hip thigh pain (since about 2006). Insomnia. Occasional bladder problems. Last problems with bladder were more than 2 months ago for a total of 3 episodes. For the last 2 months she has not had any further bladder problems.  Recent hip and knee radiographs are reviewed with her and show mild degenerative changes.  She reports she had recent bone density and mammogram.  She maintains contact with primary care.      Pain Inventory Average Pain 8 Pain Right Now 8 My pain is constant, dull, stabbing, tingling and aching  In the last 24 hours, has pain interfered with the following? General activity 8 Relation with others 8 Enjoyment of life 8 What TIME of day is your pain at its worst? all Sleep (in general) Poor  Pain is worse with: walking, bending, sitting and standing Pain improves with: rest, heat/ice, medication and injections Relief from Meds: 6  Mobility walk without assistance how many minutes can you walk? 5-10 ability to climb steps?  yes do you drive?  yes  Function disabled: date disabled 2007 I need assistance with the following:  dressing, bathing, household duties and shopping  Neuro/Psych bladder control problems weakness numbness tremor tingling trouble walking spasms depression anxiety  Prior Studies Any changes since last visit?  no  Physicians involved in your care Any changes since last visit?  no   Family History  Problem Relation Age of Onset  . Stroke Mother   . Heart disease Mother   .  Cancer Mother   . Hypertension Mother   . Arthritis Mother   . Depression Mother   . Cancer Father   . Arthritis Father   . Depression Father    History   Social History  . Marital Status: Married    Spouse Name: N/A    Number of Children: N/A  . Years of Education: N/A   Social History Main Topics  . Smoking status: Former Games developer  . Smokeless tobacco: Never Used  . Alcohol Use: No  . Drug Use: None  . Sexually Active: None   Other Topics Concern  . None   Social History Narrative  . None   Past Surgical History  Procedure Laterality Date  . Abdominal hysterectomy     Past Medical History  Diagnosis Date  . Depression   . Hyperlipidemia   . Neuromuscular disorder   . AC (acromioclavicular) joint bone spurs   . Pinched nerve in neck    BP 113/59  Pulse 70  Resp 14  Ht 5\' 3"  (1.6 m)  Wt 115 lb 6.4 oz (52.345 kg)  BMI 20.45 kg/m2  SpO2 100%    Review of Systems  HENT: Positive for neck pain.   Respiratory: Positive for shortness of breath.   Cardiovascular: Positive for leg swelling.  Gastrointestinal: Positive for nausea, vomiting, abdominal pain, diarrhea and constipation.       IBS  Musculoskeletal: Positive for back pain and gait problem.  Neurological: Positive for tremors, weakness and numbness.  Tingling  Psychiatric/Behavioral: Positive for dysphoric mood. The patient is nervous/anxious.   All other systems reviewed and are negative.       Objective:   Physical Exam  The patient is a well-developed, thin woman who is in no apparent distress.  She is oriented x3 her speech is clear, affect initially depressed however as we talked she did laugh and brighten up.  Cranial nerves are grossly intact  Reflexes are 2+ in the upper extremities biceps triceps brachioradialis  Reflexes are 2+ in the lower extremities patellar tendon and Achilles tendon  No abnormal tone clonus or tremors are noted  (Patient reports taking Valium as well as  baclofen earlier in the day)  Reports slightly decreased sensation left lower extremity compared to right to light touch  Manual muscle testing reveals good strength in both upper and lower extremities without obvious focal deficit. Pain complaints are noted during manual muscle testing.  Active Wrist flexion no longer increases pain along the medial epicondyle bilaterally  Coordination is intact.   Toes are downgoing no clonus is noted  Transitions from sit to stand, antalgic gait noted  Decreased range of motion lumbar spine in all planes  Tenderness noted along left lateral hip  Tenderness also noted over bilateral medial epicondyle's, full range of motion at wrist noted full range of motion at elbow noted with flexion extension as well as supination and pronation  Pain in the medial epicondyle is aggravated with forced wrist flexion   Cervical range of motion is limited in extension. Rotation right and left are near normal.  Right shoulder range of motion limited to 90. Left shoulder range of motion 110 of abduction.         Assessment & Plan:  1. Bilateral medial epicondylitis: Improved Continue Voltaren gel to each elbow 4 times a day, icing 10-15 minutes 3 times a day, stretches as directed  2. Left trochanteric bursitis:  Ice to left trochanter several times a day as directed  Continue home stretching program  Consider left trochanter hip injection in the upcoming weeks if still problematic  Hip stretches were reviewed with her today, recommend aquatic therapy and daily walking.  4. Bladder problems: resolved, last problem was 2 months ago.  5. Diffuse pain: Etiology may be multifactorial she has a history of fibromyalgia as well as syringomyelia, and tarlov cysts, contributing rolling pain unclear. Maintain contact with neurosurgeon for monitoring syrinx.(She has an appointment with neurosurgeon Dr. Lovell Sheehan in April) Plan: Patient has tolerated Lyrica 50 mg 3 times a day,  consider increasing to 75 mg 3 times a day.(He is not interested increasing dose of Lyrica at this time)   She would like to address this with her PCP as this medication is currently prescribed by PCP.   On tramadol but does not like to take it. Makes her nauseated at times, he is currently tolerating a half a tablet at noon each day. She is currently satisfied with her pain medications at this time. F/u 3 months ORT 6

## 2012-05-12 NOTE — Patient Instructions (Signed)
Try to walk 3 times per day 5-10 minutes.   I am also encouraging you to consider a fibromyalgia aquatic program 2-3 times per week.  Keep appt with neurosurgeon in April.  Continue to use voltaren gel as prescribed.  Follow up in 3 months

## 2012-05-25 ENCOUNTER — Telehealth: Payer: Self-pay

## 2012-05-25 NOTE — Telephone Encounter (Signed)
Patient called wanting to talk to someone concerning something that is in her records.  Please call.

## 2012-05-26 NOTE — Telephone Encounter (Signed)
Patient says she in not able to pay for aquatic therapy at this time.  She is trying to walk as much as possible but it is difficult.  She just wanted to make [place] aware.

## 2012-07-07 ENCOUNTER — Encounter
Payer: Medicaid Other | Attending: Physical Medicine and Rehabilitation | Admitting: Physical Medicine and Rehabilitation

## 2012-07-07 ENCOUNTER — Encounter: Payer: Self-pay | Admitting: Physical Medicine and Rehabilitation

## 2012-07-07 VITALS — BP 100/61 | HR 80 | Resp 14 | Ht 64.0 in | Wt 116.0 lb

## 2012-07-07 DIAGNOSIS — M7062 Trochanteric bursitis, left hip: Secondary | ICD-10-CM

## 2012-07-07 DIAGNOSIS — M25511 Pain in right shoulder: Secondary | ICD-10-CM

## 2012-07-07 DIAGNOSIS — M25519 Pain in unspecified shoulder: Secondary | ICD-10-CM

## 2012-07-07 DIAGNOSIS — IMO0001 Reserved for inherently not codable concepts without codable children: Secondary | ICD-10-CM

## 2012-07-07 DIAGNOSIS — M25569 Pain in unspecified knee: Secondary | ICD-10-CM | POA: Insufficient documentation

## 2012-07-07 DIAGNOSIS — M7702 Medial epicondylitis, left elbow: Secondary | ICD-10-CM

## 2012-07-07 DIAGNOSIS — IMO0002 Reserved for concepts with insufficient information to code with codable children: Secondary | ICD-10-CM

## 2012-07-07 DIAGNOSIS — G950 Syringomyelia and syringobulbia: Secondary | ICD-10-CM

## 2012-07-07 DIAGNOSIS — M7700 Medial epicondylitis, unspecified elbow: Secondary | ICD-10-CM

## 2012-07-07 DIAGNOSIS — M797 Fibromyalgia: Secondary | ICD-10-CM

## 2012-07-07 DIAGNOSIS — G8929 Other chronic pain: Secondary | ICD-10-CM | POA: Insufficient documentation

## 2012-07-07 DIAGNOSIS — M76899 Other specified enthesopathies of unspecified lower limb, excluding foot: Secondary | ICD-10-CM

## 2012-07-07 DIAGNOSIS — M792 Neuralgia and neuritis, unspecified: Secondary | ICD-10-CM

## 2012-07-07 DIAGNOSIS — M25559 Pain in unspecified hip: Secondary | ICD-10-CM | POA: Insufficient documentation

## 2012-07-07 NOTE — Progress Notes (Signed)
Subjective:    Patient ID: Carmen Deleon, female    DOB: February 27, 1956, 57 y.o.   MRN: [id number]  HPI   The patient is a 57 year old woman with multiple pain complaints. She has a known cervical/thoracic syrinx and has seen neurosurgery ( Dr. Lorinda Creed) who is currently following. She has a long history of fibromyalgia. She has Tarlov cysts. She is chronic shoulder pain/rotator cuff problems.   Her chief complaint today is diffuse body pain. Chronic ongoing left hip thigh and leg pain (since about 2006). Insomnia. Occasional bladder problems. Last problems with bladder were more than 3 months ago for a total of 3 episodes. For the last 3 months she has not had any further bladder problems.  Recent hip and knee radiographs are reviewed with her and show mild degenerative changes.    Other complaints today include ongoing left shoulder pain. She had an injection a couple months ago into the right shoulder which is helped her significantly.  She also complains of lateral left hip pain, this pain radiates laterally along the leg to the knee. She also has some chronic left leg pain which radiates posteriorly  all the way down the left leg.  She maintains contact with primary care.       Pain Inventory Average Pain 9 Pain Right Now 8 My pain is constant, dull, stabbing and aching  In the last 24 hours, has pain interfered with the following? General activity 9 Relation with others 9 Enjoyment of life 9 What TIME of day is your pain at its worst? morning evening and night Sleep (in general) Poor  Pain is worse with: walking, bending, sitting and standing Pain improves with: heat/ice, medication and injections Relief from Meds: 6  Mobility walk without assistance how many minutes can you walk? 5-10 ability to climb steps?  yes do you drive?  yes  Function disabled: date disabled . I need assistance with the following:  household duties and shopping  Neuro/Psych bowel control  problems weakness numbness tremor trouble walking spasms dizziness confusion depression anxiety  Prior Studies Any changes since last visit?  no  Physicians involved in your care Any changes since last visit?  no   Family History  Problem Relation Age of Onset  . Stroke Mother   . Heart disease Mother   . Cancer Mother   . Hypertension Mother   . Arthritis Mother   . Depression Mother   . Cancer Father   . Arthritis Father   . Depression Father    History   Social History  . Marital Status: Married    Spouse Name: N/A    Number of Children: N/A  . Years of Education: N/A   Social History Main Topics  . Smoking status: Former Games developer  . Smokeless tobacco: Never Used  . Alcohol Use: No  . Drug Use: None  . Sexually Active: None   Other Topics Concern  . None   Social History Narrative  . None   Past Surgical History  Procedure Laterality Date  . Abdominal hysterectomy     Past Medical History  Diagnosis Date  . Depression   . Hyperlipidemia   . Neuromuscular disorder   . AC (acromioclavicular) joint bone spurs   . Pinched nerve in neck    BP 100/61  Pulse 80  Resp 14  Ht 5\' 4"  (1.626 m)  Wt 116 lb (52.617 kg)  BMI 19.9 kg/m2  SpO2 99%    Review of Systems  HENT: Positive  for neck pain.   Cardiovascular: Positive for leg swelling.  Gastrointestinal: Positive for abdominal pain and constipation.       Bowel Control Problems  Musculoskeletal: Positive for back pain.  Neurological: Positive for dizziness, tremors, weakness and numbness.  Psychiatric/Behavioral: Positive for dysphoric mood. The patient is nervous/anxious.   All other systems reviewed and are negative.       Objective:   Physical Exam The patient is a well-developed, thin woman who is in no apparent distress.  She is oriented x3 her speech is clear, affect over all bright and she did laugh some as well.  Cranial nerves are grossly intact  Reflexes are 2+ in the upper  extremities biceps triceps brachioradialis  Reflexes are 2+ in the lower extremities patellar tendon and Achilles tendon  No abnormal tone clonus or tremors are noted   Reports slightly decreased sensation left lower extremity compared to right to light touch and pp. Manual muscle testing reveals good strength in both upper and lower extremities without obvious focal deficit. Pain complaints are noted during manual muscle testing.  Active left Wrist flexion  increases pain along the medial epicondyle bilaterally L greater than R. Coordination is intact.  Toes are downgoing no clonus is noted  Transitions from sit to stand, antalgic gait noted  Decreased range of motion lumbar spine in all planes   Tenderness noted along left lateral hip   Tenderness also noted over bilateral medial epicondyle's, full range of motion at wrist noted full range of motion at elbow noted with flexion extension as well as supination and pronation  Pain in the medial epicondyle is aggravated with forced wrist flexion  Cervical range of motion is limited in extension. Rotation right and left are near normal.  Right shoulder range of motion 120 degrees which has improved (after injection) was less than 90. Left shoulder range of motion 80 of abduction.         Assessment & Plan:  1. Bilateral medial epicondylitis: Improved  Continue Voltaren gel to each elbow 4 times a day, icing 10-15 minutes 3 times a day, stretches as directed  2. Left trochanteric bursitis:  Ice to left trochanter several times a day as directed  Continue home stretching program  Consider left trochanter hip injection  if still problematic  Hip stretches were reviewed with her today, recommend aquatic therapy and daily walking.   3. Left Shoulder Pain may be multifactorial, rotator cuff tendinitis/bursitis/cervical radiculopathy. Injection to left shoulder improved range of motion and pain substantially recommend left shoulder  injection. Patient would like to think about this.  4. Right shoulder pain and decreased ROM significantly improved after injection.  4. Bladder problems- stable, no further episodes of incontinance resolved, last problem was 3 months ago. She also talked to PCP also, wants to hold off on further W/u with urologist.  5. Diffuse pain: Etiology may be multifactorial she has a history of fibromyalgia as well as syringomyelia, and tarlov cysts, radiculopathy, contributing to her pain unclear. Maintain contact with neurosurgeon for monitoring syrinx and possible surgical intervention for neck pain and radiculopathy.(She has an appointment with neurosurgeon Dr. Lovell Sheehan on PRN basis at this time)  Plan: Patient has tolerated Lyrica 50 mg 3 times a day, consider increasing to 75 mg 3 times a day.(She is not interested increasing dose of Lyrica at this time, she believe she did not do well at higher doses)  She addressed this with her PCP and they decided to keep current dose.  On tramadol but does not like to take it.  Makes her nauseated at times, she is currently tolerating a half a tablet at noon each day.  has only used it  3-4 times in last month. She is currently satisfied with her pain medications at this time.  Baclofen is used prn---3 times in the last month.  Recommend f/u with Dr. Lovell Sheehan per his recommendation for syrinx F/u 3 months  ORT 6

## 2012-07-07 NOTE — Patient Instructions (Signed)
Consider left shoulder injection   Consider left trochanter(hip) injection  Continue to use your voltaren gel on your left elbow.  Maintain contact with Dr. Lovell Sheehan to monitor your spinal cyst.    Insomnia Insomnia is frequent trouble falling and/or staying asleep. Insomnia can be a long term problem or a short term problem. Both are common. Insomnia can be a short term problem when the wakefulness is related to a certain stress or worry. Long term insomnia is often related to ongoing stress during waking hours and/or poor sleeping habits. Overtime, sleep deprivation itself can make the problem worse. Every little thing feels more severe because you are overtired and your ability to cope is decreased. CAUSES   Stress, anxiety, and depression.  Poor sleeping habits.  Distractions such as TV in the bedroom.  Naps close to bedtime.  Engaging in emotionally charged conversations before bed.  Technical reading before sleep.  Alcohol and other sedatives. They may make the problem worse. They can hurt normal sleep patterns and normal dream activity.  Stimulants such as caffeine for several hours prior to bedtime.  Pain syndromes and shortness of breath can cause insomnia.  Exercise late at night.  Changing time zones may cause sleeping problems (jet lag). It is sometimes helpful to have someone observe your sleeping patterns. They should look for periods of not breathing during the night (sleep apnea). They should also look to see how long those periods last. If you live alone or observers are uncertain, you can also be observed at a sleep clinic where your sleep patterns will be professionally monitored. Sleep apnea requires a checkup and treatment. Give your caregivers your medical history. Give your caregivers observations your family has made about your sleep.  SYMPTOMS   Not feeling rested in the morning.  Anxiety and restlessness at bedtime.  Difficulty falling and staying  asleep. TREATMENT   Your caregiver may prescribe treatment for an underlying medical disorders. Your caregiver can give advice or help if you are using alcohol or other drugs for self-medication. Treatment of underlying problems will usually eliminate insomnia problems.  Medications can be prescribed for short time use. They are generally not recommended for lengthy use.  Over-the-counter sleep medicines are not recommended for lengthy use. They can be habit forming.  You can promote easier sleeping by making lifestyle changes such as:  Using relaxation techniques that help with breathing and reduce muscle tension.  Exercising earlier in the day.  Changing your diet and the time of your last meal. No night time snacks.  Establish a regular time to go to bed.  Counseling can help with stressful problems and worry.  Soothing music and white noise may be helpful if there are background noises you cannot remove.  Stop tedious detailed work at least one hour before bedtime. HOME CARE INSTRUCTIONS   Keep a diary. Inform your caregiver about your progress. This includes any medication side effects. See your caregiver regularly. Take note of:  Times when you are asleep.  Times when you are awake during the night.  The quality of your sleep.  How you feel the next day. This information will help your caregiver care for you.  Get out of bed if you are still awake after 15 minutes. Read or do some quiet activity. Keep the lights down. Wait until you feel sleepy and go back to bed.  Keep regular sleeping and waking hours. Avoid naps.  Exercise regularly.  Avoid distractions at bedtime. Distractions include watching television  or engaging in any intense or detailed activity like attempting to balance the household checkbook.  Develop a bedtime ritual. Keep a familiar routine of bathing, brushing your teeth, climbing into bed at the same time each night, listening to soothing music.  Routines increase the success of falling to sleep faster.  Use relaxation techniques. This can be using breathing and muscle tension release routines. It can also include visualizing peaceful scenes. You can also help control troubling or intruding thoughts by keeping your mind occupied with boring or repetitive thoughts like the old concept of counting sheep. You can make it more creative like imagining planting one beautiful flower after another in your backyard garden.  During your day, work to eliminate stress. When this is not possible use some of the previous suggestions to help reduce the anxiety that accompanies stressful situations. MAKE SURE YOU:   Understand these instructions.  Will watch your condition.  Will get help right away if you are not doing well or get worse. Document Released: 02/15/2000 Document Revised: 05/12/2011 Document Reviewed: 03/17/2007 [facility] Patient Information 2013 [place], [place].

## 2013-03-22 ENCOUNTER — Ambulatory Visit: Payer: Self-pay | Admitting: Family Medicine

## 2013-03-28 ENCOUNTER — Ambulatory Visit: Payer: Self-pay | Admitting: Family Medicine

## 2013-04-28 IMAGING — CR DG HIP COMPLETE 2+V*R*
2 series · 2 of 2 positions shown · non-contrast
Comparison: None.

CLINICAL DATA: Chronic bilateral hip pain.  No known injury.
History of fibromyalgia.

RIGHT HIP - COMPLETE 2+ VIEW

[view not recorded (1 of 2)]
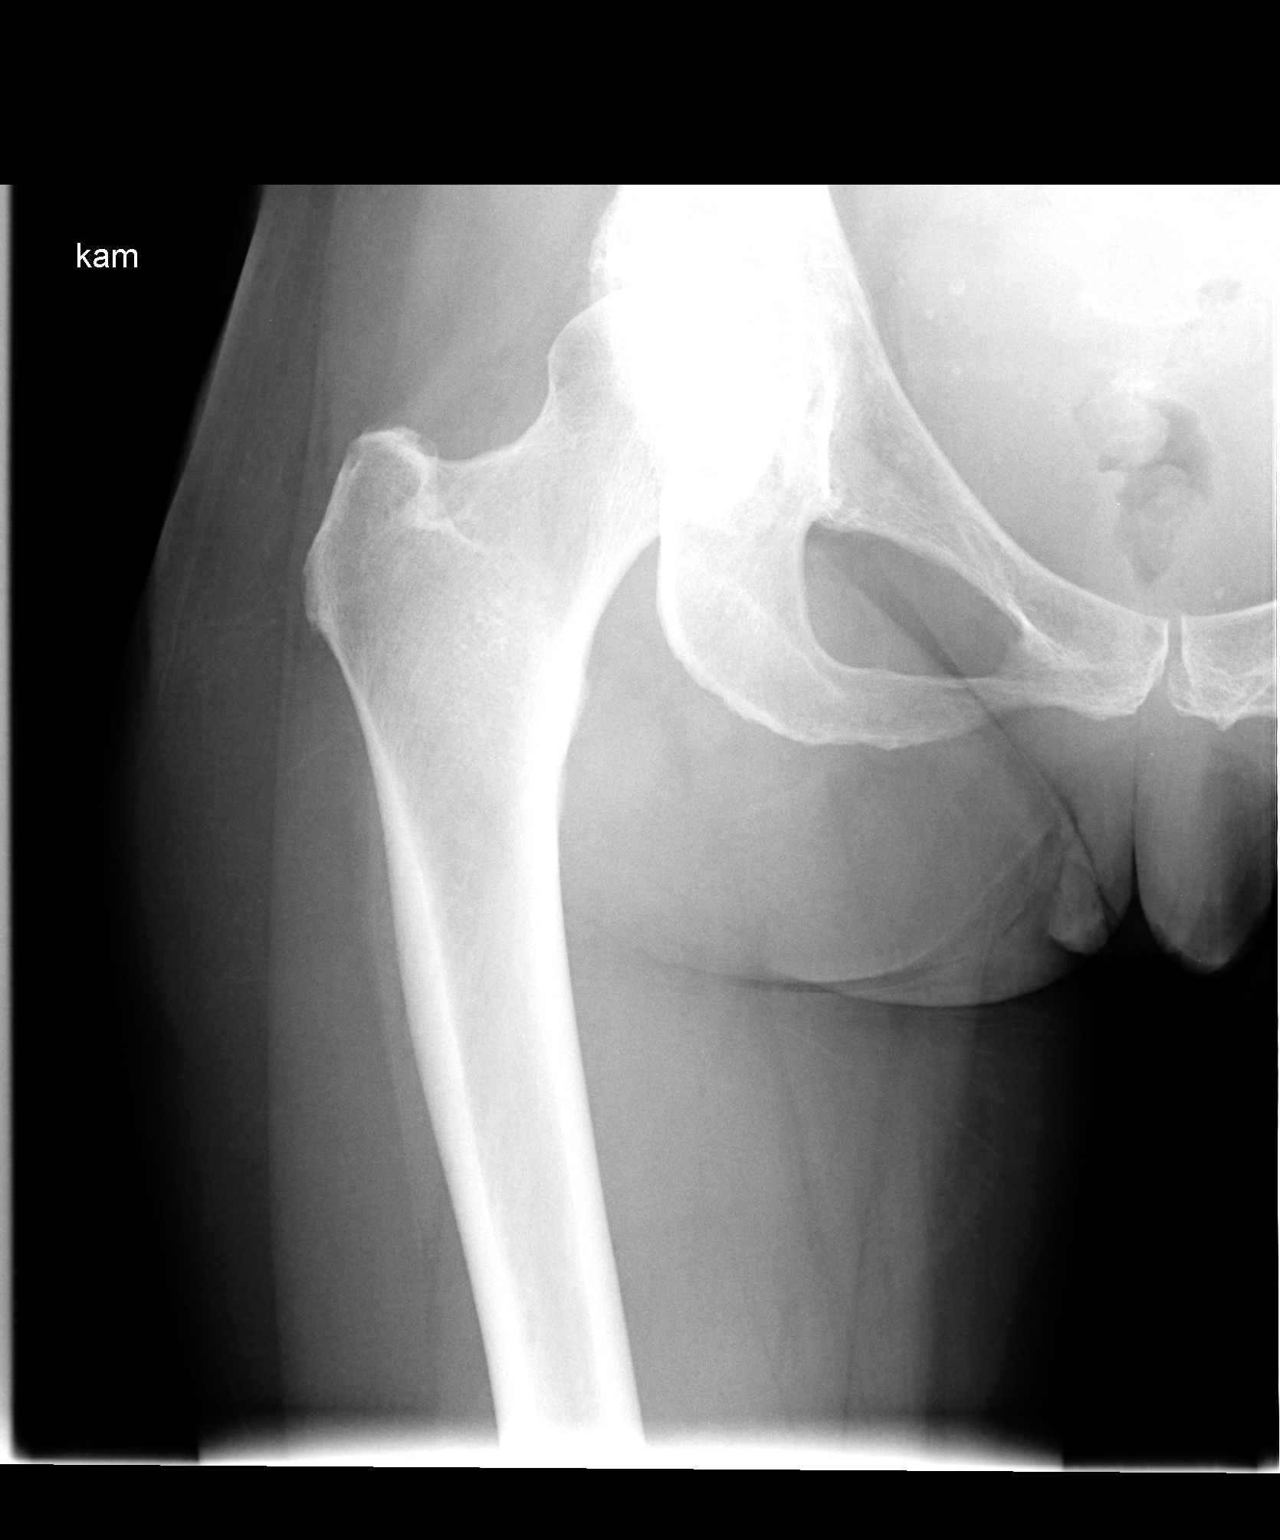

[view not recorded (2 of 2)]
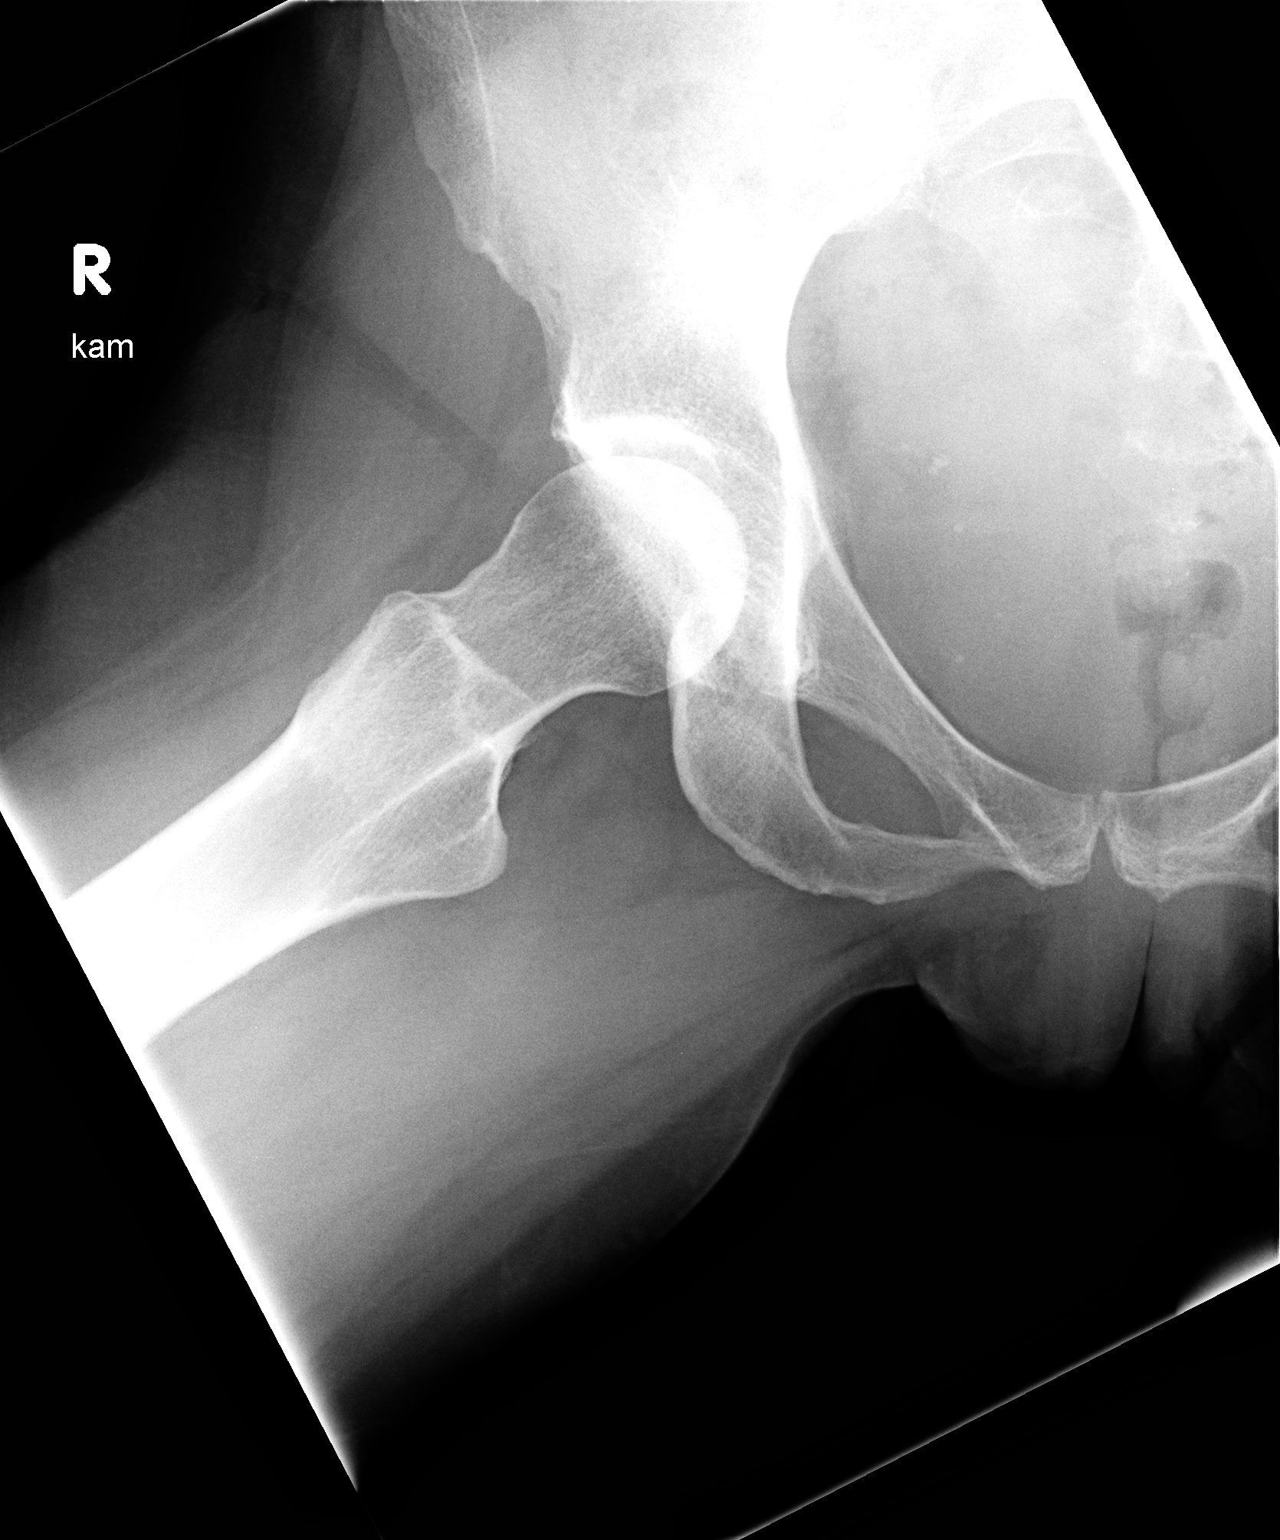

[2 of 2 positions shown; findings below may reference images not displayed]

FINDINGS: The mineralization and alignment are normal.  There is no
evidence of acute fracture or dislocation.  There is no evidence of
femoral head avascular necrosis.  There are mild degenerative
changes of the right hip, similar to those seen on the left.
Pelvic calcifications are likely phleboliths.
IMPRESSION: Mild right hip degenerative changes.  No acute osseous findings.

## 2013-05-10 IMAGING — MG MM CAD SCREENING MAMMO
1 series · 4 of 4 positions shown · non-contrast
Comparison: none

REASON FOR EXAM: SCR MAMMO
COMMENTS:

PROCEDURE:     MAM - MAM DGTL SCREENING MAMMO W/CAD  - February 23, 2012  [DATE]
RESULT:     COMPARISON:  02/20/2010, 04/30/2005, 03/02/2007
TECHNIQUE: Digital screening mammograms were obtained. FDA approved
computer-aided detection (CAD) for mammography was utilized for this study.
BREAST COMPOSITION: The breast composition is EXTREMELY DENSE (glandular
tissue greater than 75%). This decreases the sensitivity of mammography.

[R CC · right · 4 of 4 slices shown]
[im 1/4]
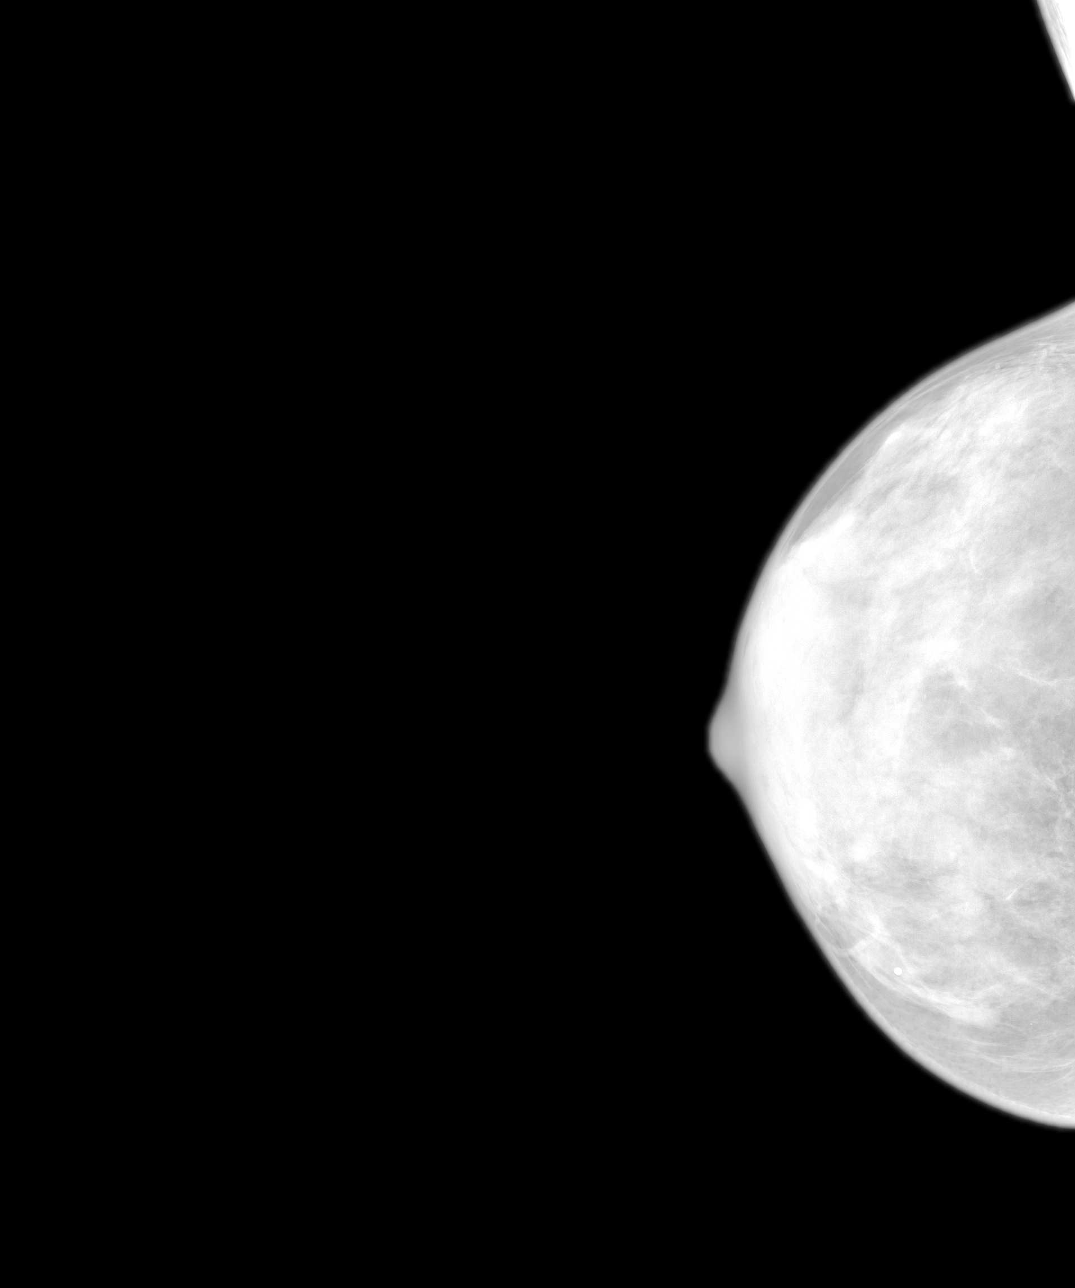
[im 2/4]
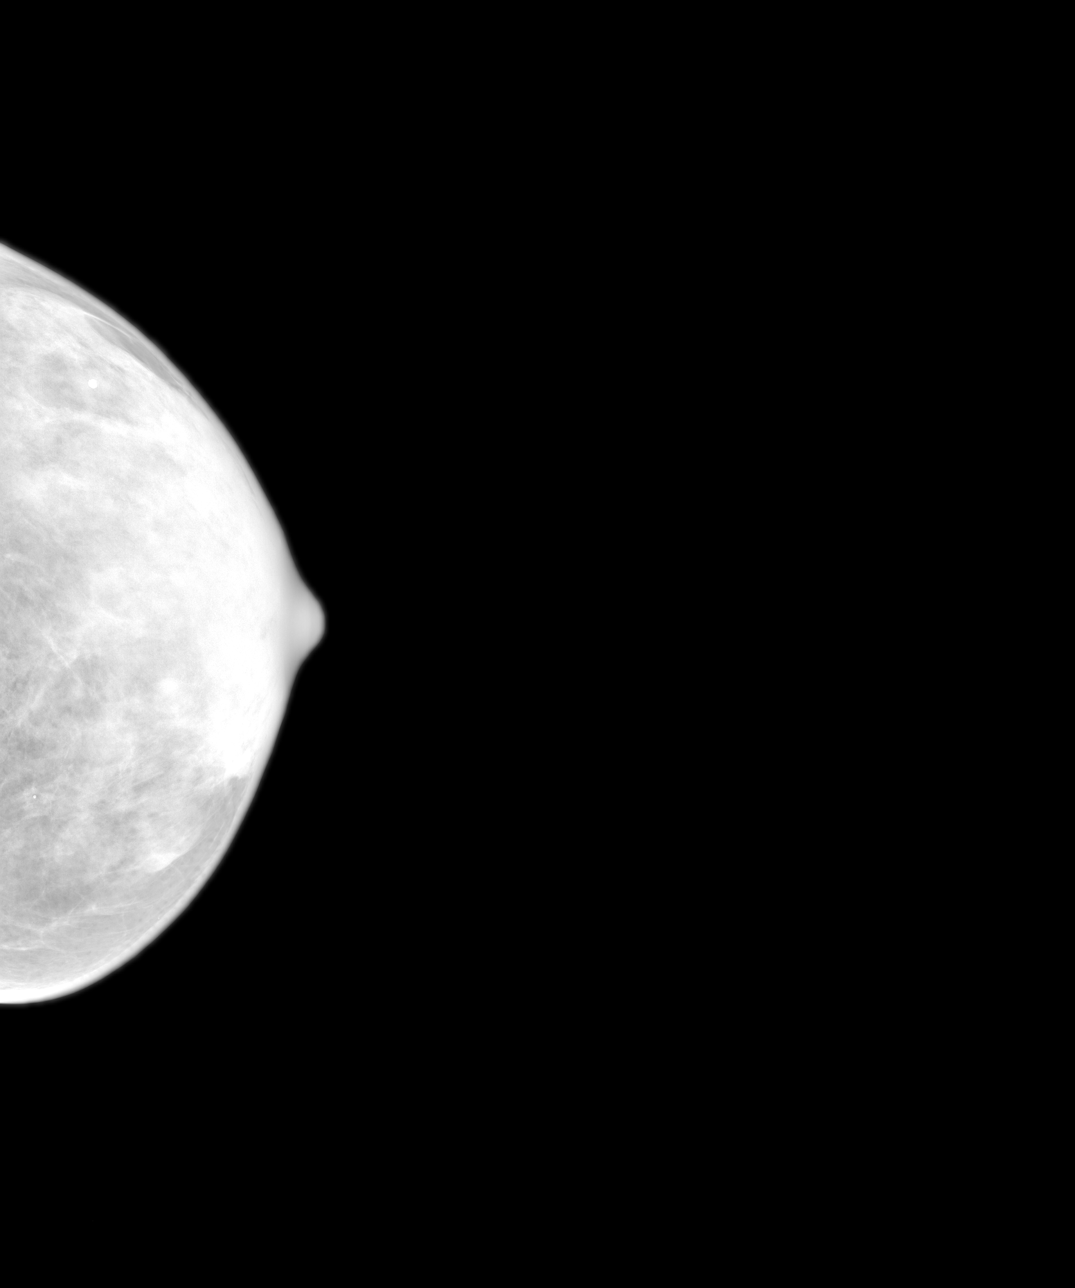
[im 3/4]
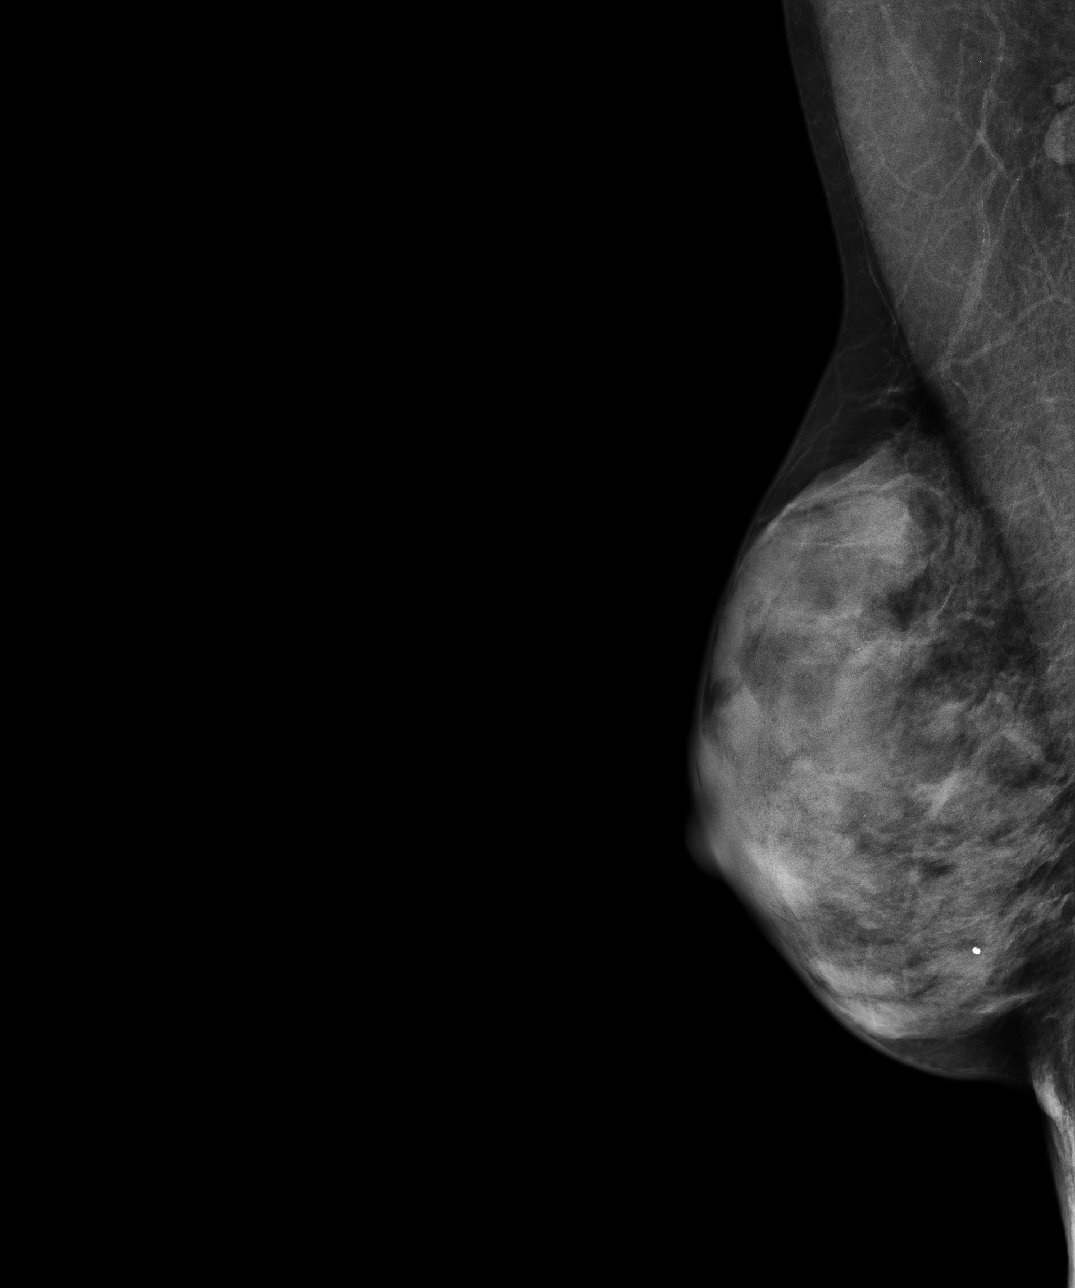
[im 4/4]
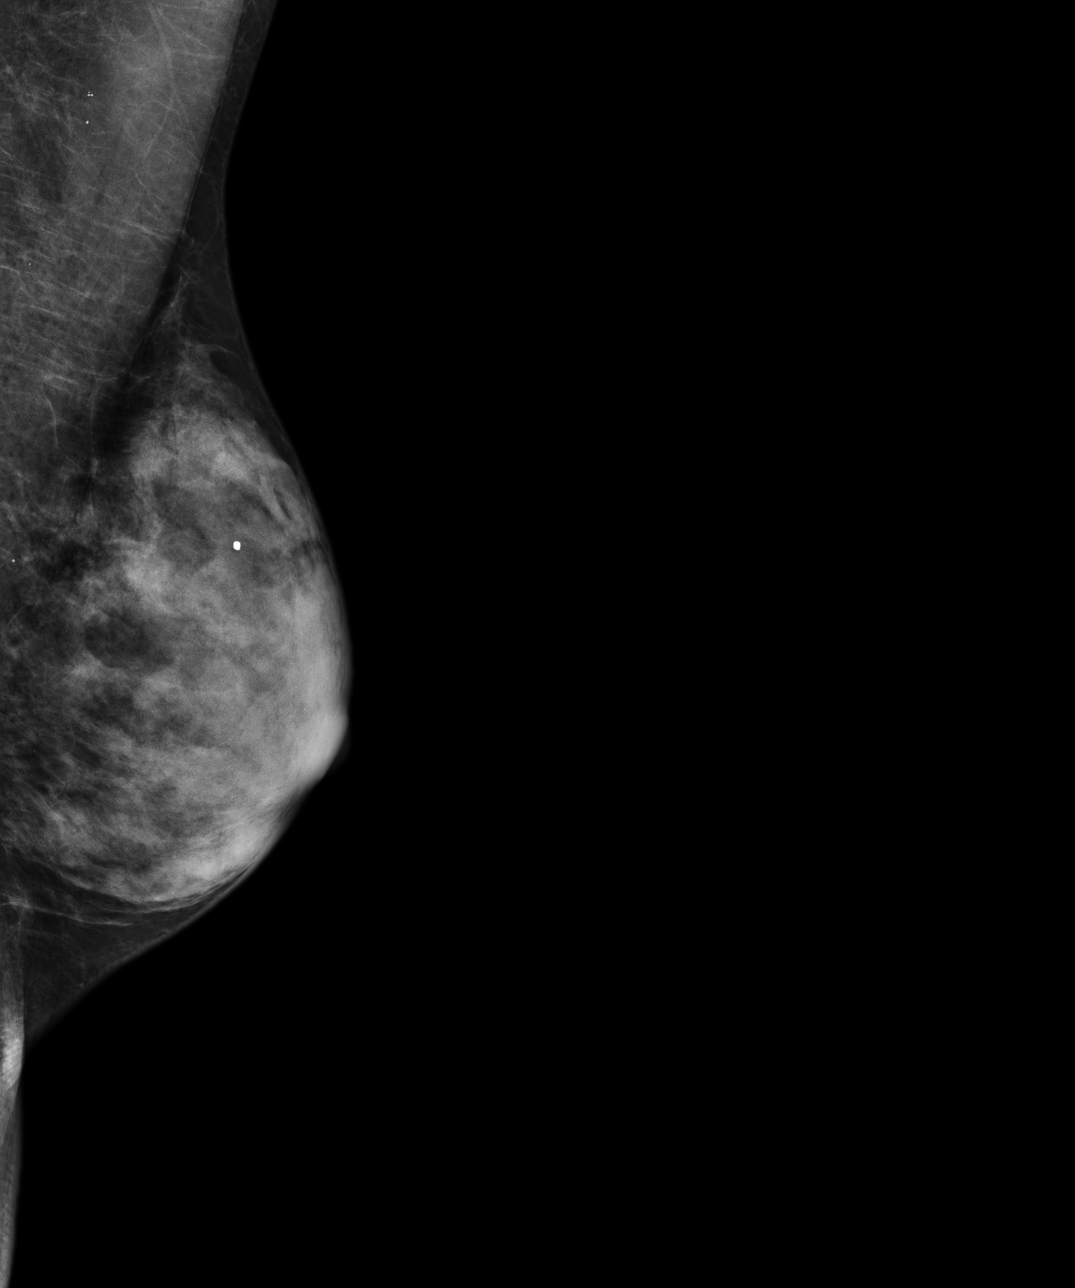

[4 of 4 positions shown; findings below may reference images not displayed]

FINDING: There is no dominant mass, architectural distortion or clusters of
suspicious microcalcifications.
IMPRESSION: 1.     Stable bilateral mammogram.
2.     Annual mammographic follow up recommended.

BI-RADS:  Category 2- Benign.

A negative mammogram report does not preclude biopsy or other evaluation of
a clinically palpable or otherwise suspicious mass or lesion. Breast cancer
may not be detected by mammography in up to 10% of cases.

[REDACTED]

## 2013-06-28 ENCOUNTER — Emergency Department: Payer: Self-pay | Admitting: Emergency Medicine

## 2013-08-30 ENCOUNTER — Telehealth: Payer: Self-pay | Admitting: Gastroenterology

## 2013-09-26 ENCOUNTER — Ambulatory Visit: Payer: Self-pay | Admitting: Family Medicine

## 2013-11-09 ENCOUNTER — Other Ambulatory Visit: Payer: Self-pay | Admitting: Neurological Surgery

## 2013-11-09 DIAGNOSIS — G950 Syringomyelia and syringobulbia: Secondary | ICD-10-CM

## 2013-11-22 ENCOUNTER — Ambulatory Visit
Admission: RE | Admit: 2013-11-22 | Discharge: 2013-11-22 | Disposition: A | Discharge status: Home / Self Care | Payer: Medicaid Other | Source: Ambulatory Visit | Attending: Neurological Surgery | Admitting: Neurological Surgery

## 2013-11-22 DIAGNOSIS — G950 Syringomyelia and syringobulbia: Secondary | ICD-10-CM

## 2013-11-25 ENCOUNTER — Other Ambulatory Visit: Payer: Self-pay | Admitting: Neurological Surgery

## 2013-11-29 NOTE — Telephone Encounter (Signed)
1 

## 2013-12-02 ENCOUNTER — Encounter (HOSPITAL_COMMUNITY): Payer: Self-pay | Admitting: Pharmacy Technician

## 2013-12-06 ENCOUNTER — Encounter (HOSPITAL_COMMUNITY)
Admission: RE | Admit: 2013-12-06 | Discharge: 2013-12-06 | Disposition: A | Discharge status: Home / Self Care | Payer: Medicaid Other | Source: Ambulatory Visit | Attending: Neurological Surgery | Admitting: Neurological Surgery

## 2013-12-06 ENCOUNTER — Encounter (HOSPITAL_COMMUNITY): Payer: Self-pay

## 2013-12-06 DIAGNOSIS — G43909 Migraine, unspecified, not intractable, without status migrainosus: Secondary | ICD-10-CM | POA: Diagnosis not present

## 2013-12-06 DIAGNOSIS — I371 Nonrheumatic pulmonary valve insufficiency: Secondary | ICD-10-CM | POA: Diagnosis not present

## 2013-12-06 DIAGNOSIS — R011 Cardiac murmur, unspecified: Secondary | ICD-10-CM | POA: Insufficient documentation

## 2013-12-06 DIAGNOSIS — F419 Anxiety disorder, unspecified: Secondary | ICD-10-CM | POA: Diagnosis not present

## 2013-12-06 DIAGNOSIS — M797 Fibromyalgia: Secondary | ICD-10-CM | POA: Insufficient documentation

## 2013-12-06 DIAGNOSIS — R0602 Shortness of breath: Secondary | ICD-10-CM | POA: Diagnosis not present

## 2013-12-06 DIAGNOSIS — R1310 Dysphagia, unspecified: Secondary | ICD-10-CM | POA: Insufficient documentation

## 2013-12-06 DIAGNOSIS — I272 Other secondary pulmonary hypertension: Secondary | ICD-10-CM | POA: Diagnosis not present

## 2013-12-06 DIAGNOSIS — R0600 Dyspnea, unspecified: Secondary | ICD-10-CM | POA: Diagnosis not present

## 2013-12-06 DIAGNOSIS — E785 Hyperlipidemia, unspecified: Secondary | ICD-10-CM | POA: Insufficient documentation

## 2013-12-06 DIAGNOSIS — F329 Major depressive disorder, single episode, unspecified: Secondary | ICD-10-CM | POA: Diagnosis not present

## 2013-12-06 DIAGNOSIS — Z0000 Encounter for general adult medical examination without abnormal findings: Secondary | ICD-10-CM | POA: Insufficient documentation

## 2013-12-06 DIAGNOSIS — R002 Palpitations: Secondary | ICD-10-CM | POA: Insufficient documentation

## 2013-12-06 DIAGNOSIS — M5000 Cervical disc disorder with myelopathy, unspecified cervical region: Secondary | ICD-10-CM | POA: Insufficient documentation

## 2013-12-06 DIAGNOSIS — K3184 Gastroparesis: Secondary | ICD-10-CM | POA: Insufficient documentation

## 2013-12-06 DIAGNOSIS — K219 Gastro-esophageal reflux disease without esophagitis: Secondary | ICD-10-CM | POA: Diagnosis not present

## 2013-12-06 DIAGNOSIS — I340 Nonrheumatic mitral (valve) insufficiency: Secondary | ICD-10-CM | POA: Insufficient documentation

## 2013-12-06 HISTORY — DX: Palpitations: R00.2

## 2013-12-06 HISTORY — DX: Gastroparesis: K31.84

## 2013-12-06 HISTORY — DX: Nausea with vomiting, unspecified: R11.2

## 2013-12-06 HISTORY — DX: Anxiety disorder, unspecified: F41.9

## 2013-12-06 HISTORY — DX: Cardiac murmur, unspecified: R01.1

## 2013-12-06 HISTORY — DX: Failed or difficult intubation, initial encounter: T88.4XXA

## 2013-12-06 HISTORY — DX: Dysphagia, unspecified: R13.10

## 2013-12-06 HISTORY — DX: Personal history of transient ischemic attack (TIA), and cerebral infarction without residual deficits: Z86.73

## 2013-12-06 HISTORY — DX: Headache: R51

## 2013-12-06 HISTORY — DX: Other specified postprocedural states: Z98.890

## 2013-12-06 HISTORY — DX: Headache, unspecified: R51.9

## 2013-12-06 HISTORY — DX: Unspecified osteoarthritis, unspecified site: M19.90

## 2013-12-06 HISTORY — DX: Fibromyalgia: M79.7

## 2013-12-06 HISTORY — DX: Migraine, unspecified, not intractable, without status migrainosus: G43.909

## 2013-12-06 HISTORY — DX: Gastro-esophageal reflux disease without esophagitis: K21.9

## 2013-12-06 HISTORY — DX: Shortness of breath: R06.02

## 2013-12-06 LAB — CBC
HEMATOCRIT: 38.8 % (ref 36.0–46.0)
Hemoglobin: 12.8 g/dL (ref 12.0–15.0)
MCH: 30.1 pg (ref 26.0–34.0)
MCHC: 33 g/dL (ref 30.0–36.0)
MCV: 91.3 fL (ref 78.0–100.0)
Platelets: 224 10*3/uL (ref 150–400)
RBC: 4.25 MIL/uL (ref 3.87–5.11)
RDW: 13.2 % (ref 11.5–15.5)
WBC: 9.1 10*3/uL (ref 4.0–10.5)

## 2013-12-06 LAB — BASIC METABOLIC PANEL
Anion gap: 11 (ref 5–15)
BUN: 10 mg/dL (ref 6–23)
CHLORIDE: 100 meq/L (ref 96–112)
CO2: 28 meq/L (ref 19–32)
Calcium: 9.3 mg/dL (ref 8.4–10.5)
Creatinine, Ser: 0.76 mg/dL (ref 0.50–1.10)
GFR calc Af Amer: >90 mL/min (ref >90)
GFR calc non Af Amer: >90 mL/min (ref >90)
GLUCOSE: 82 mg/dL (ref 70–99)
POTASSIUM: 3.8 meq/L (ref 3.7–5.3)
Sodium: 139 mEq/L (ref 137–147)

## 2013-12-06 LAB — SURGICAL PCR SCREEN
MRSA, PCR: NEGATIVE
Staphylococcus aureus: NEGATIVE

## 2013-12-06 NOTE — Pre-Procedure Instructions (Signed)
Carmen Deleon  12/06/2013   Your procedure is scheduled on:  Monday, October 12th  Report to [facility] Admitting at 530 AM.  Call this number if you have problems the morning of surgery: [phone]   Remember:   Do not eat food or drink liquids after midnight.   Take these medicines the morning of surgery with A SIP OF WATER: prilosec, acyclovir, lyrica, valium if needed  Stop taking aspirin, OTC vitamins/herbal medications, NSAIDS (ibuprofen, advil, motrin) 7 days prior to surgery.   Do not wear jewelry, make-up or nail polish.  Do not wear lotions, powders, or perfumes. You may wear deodorant.  Do not shave 48 hours prior to surgery. Men may shave face and neck.  Do not bring valuables to the hospital.  [facility] is not responsible for any belongings or valuables.               Contacts, dentures or bridgework may not be worn into surgery.  Leave suitcase in the car. After surgery it may be brought to your room.  For patients admitted to the hospital, discharge time is determined by your   treatment team.               Patients discharged the day of surgery will not be allowed to drive home.  Please read over the following fact sheets that you were given: Pain Booklet, Coughing and Deep Breathing, MRSA Information and Surgical Site Infection Prevention [place] - Preparing for Surgery  Before surgery, you can play an important role.  Because skin is not sterile, your skin needs to be as free of germs as possible.  You can reduce the number of germs on you skin by washing with CHG (chlorahexidine gluconate) soap before surgery.  CHG is an antiseptic cleaner which kills germs and bonds with the skin to continue killing germs even after washing.  Please DO NOT use if you have an allergy to CHG or antibacterial soaps.  If your skin becomes reddened/irritated stop using the CHG and inform your nurse when you arrive at Short Stay.  Do not shave (including legs and  underarms) for at least 48 hours prior to the first CHG shower.  You may shave your face.  Please follow these instructions carefully:   1.  Shower with CHG Soap the night before surgery and the morning of Surgery.  2.  If you choose to wash your hair, wash your hair first as usual with your normal shampoo.  3.  After you shampoo, rinse your hair and body thoroughly to remove the shampoo.  4.  Use CHG as you would any other liquid soap.  You can apply CHG directly to the skin and wash gently with scrungie or a clean washcloth.  5.  Apply the CHG Soap to your body ONLY FROM THE NECK DOWN.  Do not use on open wounds or open sores.  Avoid contact with your eyes, ears, mouth and genitals (private parts).  Wash genitals (private parts) with your normal soap.  6.  Wash thoroughly, paying special attention to the area where your surgery will be performed.  7.  Thoroughly rinse your body with warm water from the neck down.  8.  DO NOT shower/wash with your normal soap after using and rinsing off the CHG Soap.  9.  Pat yourself dry with a clean towel.            10.  Wear clean pajamas.  11.  Place clean sheets on your bed the night of your first shower and do not sleep with pets.  Day of Surgery  Do not apply any lotions/deoderants the morning of surgery.  Please wear clean clothes to the hospital/surgery center.

## 2013-12-06 NOTE — Progress Notes (Addendum)
Primary - Carmen Deleon Cardiologist - dr. Humphrey Rolls in [place] Had stress, echo, ekg, chest xray  at dr. Humphrey Rolls office - will request records Goes to [facility] for colonoscopies this is where she had issue with difficulty intubating with dr. Duane Boston - will request records

## 2013-12-07 NOTE — Progress Notes (Addendum)
Anesthesia chart review:  Patient is a 58 year old female scheduled for C5-6, C6-7 ACDF on 12/12/13 by Dr. Ellene Route.  History includes heart murmur (reported mitral regurgitation), palpitations, exertional dyspnea, depression, anxiety, GERD, migraine headaches, arthritis, fibromyalgia, HLD, gastroparesis, TIA, AC joint bone spurs, dysphagia (trouble with pills, bread), hysterectomy.  PCP was reported as Dr. Veda Canning [facility]).  She is followed at [facility] by Josephina Gip, PA-C/Dr. Lenna Gilford. She reported seeing cardiologist Dr. Neoma Laming with [place] with EKG and CXR within the past year.  She also reported prior office visit, echo, and stress test.  Records are still pending.  For anesthesia history, she reported 1) post-operative N/V and 2) DIFFICULT INTUBATION ("reportedly took two attempts to intubate" according to 08/03/13 anesthesia records) for an endoscopy in 2006 at [facility].  Her most recent UGI endoscopy was on 08/03/13 at [facility] (see Care Everywhere).  This procedure was done with propofol.  Airway then was documented as Mallampati II, TM distance > 3 FB, limited neck ROM, normal mouth opening, poor dentition.  Additional anesthesia records from [facility] requested, but are still pending at this time.  Preoperative labs noted.  Additional anesthesia records and CXR, EKG, cardiology records from [place] are still pending. I'll follow-up once available.  George Hugh [facility] Short Stay Center/Anesthesiology Phone [phone] 12/07/2013 1:27 PM  Addendum: After multiple requests from [facility], including specific dates for anesthesia records from [facility], I did not receive anything new pertinent information--just a repeat of her 08/2013 anesthesia record.  I did receive cardiology records from Dr. Neoma Laming.  He initially saw her in 04/2013 for complaints of three different episodes of severe chest pain and SOB.  Echo and stress were ordered.  Her  stress test could not definitively rule out ischemia, so she had a cardiac CT showing normal coronaries.  See below for more complete results.  EKG on 04/25/13 showed: SR, possible anterior infarct (age undetermined).  Echo on 04/26/13 showed: Mildly dilated left atrium with left ventricle, right atrium and ventricle, and aorta appear normal in size. Normal LV systolic and diastolic function, EF 50%. Normal wall motion. Mild pulmonic regurgitation. Mild to moderate tricuspid regurgitation. Mild pulmonary hypertension. Moderate mitral regurgitation. Mild to moderate aortic regurgitation. No pericardial effusion.  Nuclear stress test on 05/03/13 showed:  Probable normal study, cannot rule out ischemia in the LAD territory, correlate clinically. EF 82%. Mild apical wall reversible defect.  Cardiac CTA on 05/20/13 showed: Calcium score is 8.9. Right dominant system. Normal coronaries.   08/29/13 Carotid duplex showed: No significant disease with antegrade flow in vertebrals.    Chest CT without contrast on 05/11/13 showed: No axillary, mediastinal, hilar mass, or adenopathy. No evidence of thoracic aortic aneurysm. No evidence of lung nodule, mass, infiltrate, edema, or effusion. Visualized portions of the upper abdominal structures are unremarkable. No lytic or blastic lesions. Impression: Negative CT of the chest without IV contrast.  Based on the above information, I think she can proceed as planned.  Personally, unless there is an acute change, I do not think she will need a preoperatively CXR since she had a chest CT on 05/11/13; however, if her anesthesiologist would like one done then it can be done on the day of surgery. Her anesthesiologist can also determine the definitive anesthesia plan and if glidescope, etc may be needed due to her reported difficult intubation history.  George Hugh [facility] Short Stay Center/Anesthesiology Phone [phone] 12/08/2013 5:20 PM

## 2013-12-08 ENCOUNTER — Encounter (HOSPITAL_COMMUNITY): Payer: Self-pay

## 2013-12-11 MED ORDER — CEFAZOLIN SODIUM-DEXTROSE 2-3 GM-% IV SOLR
2.0000 g | INTRAVENOUS | Status: AC
  Administered 2013-12-12: 2 g via INTRAVENOUS
  Filled 2013-12-11: qty 50

## 2013-12-12 ENCOUNTER — Encounter (HOSPITAL_COMMUNITY): Payer: Self-pay | Admitting: Anesthesiology

## 2013-12-12 ENCOUNTER — Inpatient Hospital Stay (HOSPITAL_COMMUNITY)
Admission: RE | Admit: 2013-12-12 | Discharge: 2013-12-20 | DRG: 908 | Disposition: A | Discharge status: Home / Self Care | Payer: Medicaid Other | Source: Ambulatory Visit | Attending: Neurological Surgery | Admitting: Neurological Surgery

## 2013-12-12 ENCOUNTER — Ambulatory Visit (HOSPITAL_COMMUNITY): Payer: Medicaid Other

## 2013-12-12 ENCOUNTER — Encounter (HOSPITAL_COMMUNITY)
Admission: RE | Disposition: A | Discharge status: Home / Self Care | Payer: Self-pay | Source: Ambulatory Visit | Attending: Neurological Surgery

## 2013-12-12 ENCOUNTER — Ambulatory Visit (HOSPITAL_COMMUNITY): Payer: Medicaid Other | Admitting: Anesthesiology

## 2013-12-12 ENCOUNTER — Encounter (HOSPITAL_COMMUNITY): Payer: Medicaid Other | Admitting: Vascular Surgery

## 2013-12-12 DIAGNOSIS — R112 Nausea with vomiting, unspecified: Secondary | ICD-10-CM | POA: Diagnosis present

## 2013-12-12 DIAGNOSIS — K219 Gastro-esophageal reflux disease without esophagitis: Secondary | ICD-10-CM | POA: Diagnosis present

## 2013-12-12 DIAGNOSIS — Z885 Allergy status to narcotic agent status: Secondary | ICD-10-CM

## 2013-12-12 DIAGNOSIS — M2578 Osteophyte, vertebrae: Secondary | ICD-10-CM | POA: Diagnosis present

## 2013-12-12 DIAGNOSIS — M4712 Other spondylosis with myelopathy, cervical region: Secondary | ICD-10-CM | POA: Diagnosis present

## 2013-12-12 DIAGNOSIS — Z888 Allergy status to other drugs, medicaments and biological substances status: Secondary | ICD-10-CM

## 2013-12-12 DIAGNOSIS — Z8673 Personal history of transient ischemic attack (TIA), and cerebral infarction without residual deficits: Secondary | ICD-10-CM

## 2013-12-12 DIAGNOSIS — M797 Fibromyalgia: Secondary | ICD-10-CM | POA: Diagnosis present

## 2013-12-12 DIAGNOSIS — G950 Syringomyelia and syringobulbia: Secondary | ICD-10-CM | POA: Diagnosis present

## 2013-12-12 DIAGNOSIS — M5412 Radiculopathy, cervical region: Secondary | ICD-10-CM | POA: Diagnosis present

## 2013-12-12 DIAGNOSIS — M5020 Other cervical disc displacement, unspecified cervical region: Secondary | ICD-10-CM

## 2013-12-12 DIAGNOSIS — F419 Anxiety disorder, unspecified: Secondary | ICD-10-CM | POA: Diagnosis present

## 2013-12-12 DIAGNOSIS — T8189XA Other complications of procedures, not elsewhere classified, initial encounter: Principal | ICD-10-CM | POA: Diagnosis present

## 2013-12-12 DIAGNOSIS — M4722 Other spondylosis with radiculopathy, cervical region: Secondary | ICD-10-CM

## 2013-12-12 DIAGNOSIS — K3184 Gastroparesis: Secondary | ICD-10-CM | POA: Diagnosis present

## 2013-12-12 DIAGNOSIS — Z7982 Long term (current) use of aspirin: Secondary | ICD-10-CM

## 2013-12-12 DIAGNOSIS — E785 Hyperlipidemia, unspecified: Secondary | ICD-10-CM | POA: Diagnosis present

## 2013-12-12 DIAGNOSIS — Z79899 Other long term (current) drug therapy: Secondary | ICD-10-CM

## 2013-12-12 DIAGNOSIS — M1990 Unspecified osteoarthritis, unspecified site: Secondary | ICD-10-CM | POA: Diagnosis present

## 2013-12-12 DIAGNOSIS — Z9889 Other specified postprocedural states: Secondary | ICD-10-CM

## 2013-12-12 HISTORY — PX: ANTERIOR CERVICAL DECOMP/DISCECTOMY FUSION: SHX1161

## 2013-12-12 SURGERY — ANTERIOR CERVICAL DECOMPRESSION/DISCECTOMY FUSION 2 LEVELS
Anesthesia: General | Site: Neck

## 2013-12-12 MED ORDER — MIDAZOLAM HCL 5 MG/5ML IJ SOLN
INTRAMUSCULAR | Status: DC | PRN
  Administered 2013-12-12: 2 mg via INTRAVENOUS

## 2013-12-12 MED ORDER — ONDANSETRON HCL 4 MG/2ML IJ SOLN: 4.0000 mg | Freq: Once | INTRAMUSCULAR | Status: DC | PRN

## 2013-12-12 MED ORDER — HEMOSTATIC AGENTS (NO CHARGE) OPTIME
TOPICAL | Status: DC | PRN
  Administered 2013-12-12: 1 via TOPICAL

## 2013-12-12 MED ORDER — SODIUM CHLORIDE 0.9 % IJ SOLN
INTRAMUSCULAR | Status: AC
  Filled 2013-12-12: qty 10

## 2013-12-12 MED ORDER — LACTATED RINGERS IV SOLN
INTRAVENOUS | Status: DC
Start: 2013-12-12 — End: 2013-12-12
  Administered 2013-12-12: 08:00:00 via INTRAVENOUS

## 2013-12-12 MED ORDER — EPHEDRINE SULFATE 50 MG/ML IJ SOLN
INTRAMUSCULAR | Status: AC
  Filled 2013-12-12: qty 1

## 2013-12-12 MED ORDER — PHENOL 1.4 % MT LIQD: 1.0000 | OROMUCOSAL | Status: DC | PRN

## 2013-12-12 MED ORDER — LIDOCAINE-EPINEPHRINE 1 %-1:100000 IJ SOLN
INTRAMUSCULAR | Status: DC | PRN
  Administered 2013-12-12: 4 mL

## 2013-12-12 MED ORDER — POLYETHYLENE GLYCOL 3350 17 GM/SCOOP PO POWD: 1.0000 | Freq: Once | ORAL | Status: DC

## 2013-12-12 MED ORDER — LIDOCAINE HCL (CARDIAC) 20 MG/ML IV SOLN
INTRAVENOUS | Status: AC
  Filled 2013-12-12: qty 5

## 2013-12-12 MED ORDER — ACETAMINOPHEN 325 MG PO TABS: 650.0000 mg | ORAL_TABLET | ORAL | Status: DC | PRN

## 2013-12-12 MED ORDER — ONDANSETRON HCL 4 MG/2ML IJ SOLN
INTRAMUSCULAR | Status: DC | PRN
  Administered 2013-12-12: 4 mg via INTRAVENOUS

## 2013-12-12 MED ORDER — HYDROMORPHONE HCL 1 MG/ML IJ SOLN
INTRAMUSCULAR | Status: AC
  Filled 2013-12-12: qty 1

## 2013-12-12 MED ORDER — POLYETHYLENE GLYCOL 3350 17 G PO PACK
17.0000 g | PACK | Freq: Every day | ORAL | Status: DC | PRN
  Filled 2013-12-12: qty 1

## 2013-12-12 MED ORDER — GLYCOPYRROLATE 0.2 MG/ML IJ SOLN
INTRAMUSCULAR | Status: AC
  Filled 2013-12-12: qty 3

## 2013-12-12 MED ORDER — LACTATED RINGERS IV SOLN
INTRAVENOUS | Status: DC | PRN
  Administered 2013-12-12: 10:00:00 via INTRAVENOUS

## 2013-12-12 MED ORDER — HYDROMORPHONE HCL 1 MG/ML IJ SOLN: 0.2500 mg | INTRAMUSCULAR | Status: DC | PRN

## 2013-12-12 MED ORDER — ARTIFICIAL TEARS OP OINT
TOPICAL_OINTMENT | OPHTHALMIC | Status: AC
  Filled 2013-12-12: qty 3.5

## 2013-12-12 MED ORDER — SCOPOLAMINE 1 MG/3DAYS TD PT72
MEDICATED_PATCH | TRANSDERMAL | Status: AC
  Filled 2013-12-12: qty 1

## 2013-12-12 MED ORDER — MORPHINE SULFATE 2 MG/ML IJ SOLN
1.0000 mg | INTRAMUSCULAR | Status: DC | PRN
  Administered 2013-12-12 – 2013-12-15 (×13): 2 mg via INTRAVENOUS
  Administered 2013-12-15 (×2): 4 mg via INTRAVENOUS
  Administered 2013-12-16: 2 mg via INTRAVENOUS
  Administered 2013-12-16: 4 mg via INTRAVENOUS
  Administered 2013-12-16 – 2013-12-18 (×11): 2 mg via INTRAVENOUS
  Administered 2013-12-19: 4 mg via INTRAVENOUS
  Administered 2013-12-19: 2 mg via INTRAVENOUS
  Filled 2013-12-12: qty 2
  Filled 2013-12-12 (×2): qty 1
  Filled 2013-12-12: qty 2
  Filled 2013-12-12 (×6): qty 1
  Filled 2013-12-12: qty 2
  Filled 2013-12-12 (×10): qty 1
  Filled 2013-12-12: qty 2
  Filled 2013-12-12 (×9): qty 1

## 2013-12-12 MED ORDER — MIDAZOLAM HCL 2 MG/2ML IJ SOLN
INTRAMUSCULAR | Status: AC
  Filled 2013-12-12: qty 2

## 2013-12-12 MED ORDER — OXYCODONE HCL 5 MG/5ML PO SOLN: 5.0000 mg | Freq: Once | ORAL | Status: DC | PRN

## 2013-12-12 MED ORDER — DOCUSATE SODIUM 100 MG PO CAPS
100.0000 mg | ORAL_CAPSULE | Freq: Two times a day (BID) | ORAL | Status: DC
  Administered 2013-12-12 – 2013-12-20 (×16): 100 mg via ORAL
  Filled 2013-12-12 (×17): qty 1

## 2013-12-12 MED ORDER — ALUM & MAG HYDROXIDE-SIMETH 200-200-20 MG/5ML PO SUSP: 30.0000 mL | Freq: Four times a day (QID) | ORAL | Status: DC | PRN

## 2013-12-12 MED ORDER — SODIUM CHLORIDE 0.9 % IV SOLN: 250.0000 mL | INTRAVENOUS | Status: DC

## 2013-12-12 MED ORDER — ACYCLOVIR 400 MG PO TABS
400.0000 mg | ORAL_TABLET | Freq: Every day | ORAL | Status: DC
  Administered 2013-12-13 – 2013-12-20 (×8): 400 mg via ORAL
  Filled 2013-12-12 (×10): qty 1

## 2013-12-12 MED ORDER — 0.9 % SODIUM CHLORIDE (POUR BTL) OPTIME
TOPICAL | Status: DC | PRN
  Administered 2013-12-12: 1000 mL

## 2013-12-12 MED ORDER — SENNA 8.6 MG PO TABS
1.0000 | ORAL_TABLET | Freq: Two times a day (BID) | ORAL | Status: DC
  Administered 2013-12-12 – 2013-12-20 (×16): 8.6 mg via ORAL
  Filled 2013-12-12 (×16): qty 1

## 2013-12-12 MED ORDER — SODIUM CHLORIDE 0.9 % IJ SOLN
3.0000 mL | Freq: Two times a day (BID) | INTRAMUSCULAR | Status: DC
  Administered 2013-12-12 – 2013-12-19 (×5): 3 mL via INTRAVENOUS

## 2013-12-12 MED ORDER — FENTANYL CITRATE 0.05 MG/ML IJ SOLN
INTRAMUSCULAR | Status: AC
  Filled 2013-12-12: qty 5

## 2013-12-12 MED ORDER — LUBIPROSTONE 24 MCG PO CAPS
24.0000 ug | ORAL_CAPSULE | Freq: Two times a day (BID) | ORAL | Status: DC
  Administered 2013-12-15 – 2013-12-20 (×11): 24 ug via ORAL
  Filled 2013-12-12 (×14): qty 1

## 2013-12-12 MED ORDER — ARTIFICIAL TEARS OP OINT
TOPICAL_OINTMENT | OPHTHALMIC | Status: DC | PRN
  Administered 2013-12-12: 1 via OPHTHALMIC

## 2013-12-12 MED ORDER — BACLOFEN 10 MG PO TABS
10.0000 mg | ORAL_TABLET | Freq: Three times a day (TID) | ORAL | Status: DC
  Administered 2013-12-15 – 2013-12-20 (×12): 10 mg via ORAL
  Filled 2013-12-12 (×20): qty 1

## 2013-12-12 MED ORDER — THROMBIN 5000 UNITS EX SOLR
CUTANEOUS | Status: DC | PRN
  Administered 2013-12-12 (×2): 5000 [IU] via TOPICAL

## 2013-12-12 MED ORDER — PROPOFOL 10 MG/ML IV BOLUS
INTRAVENOUS | Status: DC | PRN
  Administered 2013-12-12: 110 mg via INTRAVENOUS

## 2013-12-12 MED ORDER — ONDANSETRON HCL 4 MG PO TABS: 4.0000 mg | ORAL_TABLET | Freq: Three times a day (TID) | ORAL | Status: DC | PRN

## 2013-12-12 MED ORDER — HYDROMORPHONE HCL 1 MG/ML IJ SOLN
0.2500 mg | INTRAMUSCULAR | Status: DC | PRN
  Administered 2013-12-12 (×4): 0.5 mg via INTRAVENOUS

## 2013-12-12 MED ORDER — BISACODYL 10 MG RE SUPP: 10.0000 mg | Freq: Every day | RECTAL | Status: DC | PRN

## 2013-12-12 MED ORDER — PHENYLEPHRINE HCL 10 MG/ML IJ SOLN
INTRAMUSCULAR | Status: DC | PRN
  Administered 2013-12-12 (×6): 80 ug via INTRAVENOUS

## 2013-12-12 MED ORDER — SODIUM CHLORIDE 0.9 % IJ SOLN: 3.0000 mL | INTRAMUSCULAR | Status: DC | PRN

## 2013-12-12 MED ORDER — OXYCODONE HCL 5 MG PO TABS: 5.0000 mg | ORAL_TABLET | Freq: Once | ORAL | Status: DC | PRN

## 2013-12-12 MED ORDER — LIDOCAINE HCL (CARDIAC) 20 MG/ML IV SOLN
INTRAVENOUS | Status: DC | PRN
  Administered 2013-12-12: 100 mg via INTRAVENOUS

## 2013-12-12 MED ORDER — DEXAMETHASONE SODIUM PHOSPHATE 10 MG/ML IJ SOLN
INTRAMUSCULAR | Status: AC
  Filled 2013-12-12: qty 1

## 2013-12-12 MED ORDER — NEOSTIGMINE METHYLSULFATE 10 MG/10ML IV SOLN
INTRAVENOUS | Status: AC
  Filled 2013-12-12: qty 1

## 2013-12-12 MED ORDER — FLEET ENEMA 7-19 GM/118ML RE ENEM
1.0000 | ENEMA | Freq: Once | RECTAL | Status: AC | PRN
  Filled 2013-12-12: qty 1

## 2013-12-12 MED ORDER — PROPOFOL 10 MG/ML IV BOLUS
INTRAVENOUS | Status: AC
  Filled 2013-12-12: qty 20

## 2013-12-12 MED ORDER — MEPERIDINE HCL 25 MG/ML IJ SOLN: 6.2500 mg | INTRAMUSCULAR | Status: DC | PRN

## 2013-12-12 MED ORDER — SODIUM CHLORIDE 0.9 % IR SOLN
Status: DC | PRN
  Administered 2013-12-12: 10:00:00

## 2013-12-12 MED ORDER — GLYCOPYRROLATE 0.2 MG/ML IJ SOLN
INTRAMUSCULAR | Status: DC | PRN
  Administered 2013-12-12: 0.4 mg via INTRAVENOUS

## 2013-12-12 MED ORDER — ROCURONIUM BROMIDE 50 MG/5ML IV SOLN
INTRAVENOUS | Status: AC
  Filled 2013-12-12: qty 2

## 2013-12-12 MED ORDER — ONDANSETRON HCL 4 MG/2ML IJ SOLN
4.0000 mg | INTRAMUSCULAR | Status: DC | PRN
  Administered 2013-12-12 – 2013-12-19 (×22): 4 mg via INTRAVENOUS
  Filled 2013-12-12 (×22): qty 2

## 2013-12-12 MED ORDER — MIRTAZAPINE 15 MG PO TABS
15.0000 mg | ORAL_TABLET | Freq: Every day | ORAL | Status: DC
  Administered 2013-12-12 – 2013-12-19 (×8): 15 mg via ORAL
  Filled 2013-12-12 (×9): qty 1

## 2013-12-12 MED ORDER — DEXAMETHASONE SODIUM PHOSPHATE 10 MG/ML IJ SOLN
INTRAMUSCULAR | Status: DC | PRN
  Administered 2013-12-12: 10 mg via INTRAVENOUS

## 2013-12-12 MED ORDER — BUPIVACAINE HCL (PF) 0.5 % IJ SOLN
INTRAMUSCULAR | Status: DC | PRN
  Administered 2013-12-12: 4 mL

## 2013-12-12 MED ORDER — ONDANSETRON HCL 4 MG/2ML IJ SOLN
4.0000 mg | Freq: Once | INTRAMUSCULAR | Status: AC | PRN
  Administered 2013-12-12: 4 mg via INTRAVENOUS

## 2013-12-12 MED ORDER — ROCURONIUM BROMIDE 100 MG/10ML IV SOLN
INTRAVENOUS | Status: DC | PRN
  Administered 2013-12-12: 50 mg via INTRAVENOUS
  Administered 2013-12-12: 10 mg via INTRAVENOUS

## 2013-12-12 MED ORDER — ACETAMINOPHEN 650 MG RE SUPP
650.0000 mg | RECTAL | Status: DC | PRN
  Administered 2013-12-13: 650 mg via RECTAL
  Filled 2013-12-12: qty 1

## 2013-12-12 MED ORDER — ONDANSETRON HCL 4 MG/2ML IJ SOLN
INTRAMUSCULAR | Status: AC
  Filled 2013-12-12: qty 2

## 2013-12-12 MED ORDER — PHENYLEPHRINE 40 MCG/ML (10ML) SYRINGE FOR IV PUSH (FOR BLOOD PRESSURE SUPPORT)
PREFILLED_SYRINGE | INTRAVENOUS | Status: AC
  Filled 2013-12-12: qty 20

## 2013-12-12 MED ORDER — NEOSTIGMINE METHYLSULFATE 10 MG/10ML IV SOLN
INTRAVENOUS | Status: DC | PRN
  Administered 2013-12-12: 3 mg via INTRAVENOUS

## 2013-12-12 MED ORDER — DIAZEPAM 2 MG PO TABS: 2.0000 mg | ORAL_TABLET | Freq: Four times a day (QID) | ORAL | Status: DC | PRN

## 2013-12-12 MED ORDER — MENTHOL 3 MG MT LOZG
1.0000 | LOZENGE | OROMUCOSAL | Status: DC | PRN
  Administered 2013-12-12 – 2013-12-13 (×2): 3 mg via ORAL
  Filled 2013-12-12 (×2): qty 9

## 2013-12-12 MED ORDER — OXYCODONE-ACETAMINOPHEN 5-325 MG PO TABS: 1.0000 | ORAL_TABLET | ORAL | Status: DC | PRN

## 2013-12-12 MED ORDER — PREGABALIN 50 MG PO CAPS
50.0000 mg | ORAL_CAPSULE | Freq: Three times a day (TID) | ORAL | Status: DC
  Administered 2013-12-12 – 2013-12-20 (×23): 50 mg via ORAL
  Filled 2013-12-12 (×24): qty 1

## 2013-12-12 MED ORDER — SCOPOLAMINE 1 MG/3DAYS TD PT72
MEDICATED_PATCH | TRANSDERMAL | Status: DC | PRN
  Administered 2013-12-12: 1 via TRANSDERMAL

## 2013-12-12 MED ORDER — DIAZEPAM 5 MG PO TABS
5.0000 mg | ORAL_TABLET | Freq: Four times a day (QID) | ORAL | Status: DC | PRN
Start: 2013-12-12 — End: 2013-12-20
  Administered 2013-12-12 – 2013-12-19 (×10): 5 mg via ORAL
  Filled 2013-12-12 (×9): qty 1

## 2013-12-12 MED ORDER — FENTANYL CITRATE 0.05 MG/ML IJ SOLN
INTRAMUSCULAR | Status: DC | PRN
  Administered 2013-12-12: 100 ug via INTRAVENOUS
  Administered 2013-12-12: 50 ug via INTRAVENOUS

## 2013-12-12 MED ORDER — PANTOPRAZOLE SODIUM 40 MG PO TBEC
40.0000 mg | DELAYED_RELEASE_TABLET | Freq: Every day | ORAL | Status: DC
  Administered 2013-12-13 – 2013-12-20 (×8): 40 mg via ORAL
  Filled 2013-12-12 (×8): qty 1

## 2013-12-12 SURGICAL SUPPLY — 57 items
ALLOGRAFT TRIAD LORDOTIC CC (Bone Implant) ×6 IMPLANT
BAG DECANTER FOR FLEXI CONT (MISCELLANEOUS) ×3 IMPLANT
BIT DRILL NEURO 2X3.1 SFT TUCH (MISCELLANEOUS) ×1 IMPLANT
BIT DRILL POWER (BIT) ×1 IMPLANT
BNDG GAUZE ELAST 4 BULKY (GAUZE/BANDAGES/DRESSINGS) IMPLANT
BUR BARREL STRAIGHT FLUTE 4.0 (BURR) ×3 IMPLANT
CANISTER SUCT 3000ML (MISCELLANEOUS) ×3 IMPLANT
CONT SPEC 4OZ CLIKSEAL STRL BL (MISCELLANEOUS) ×3 IMPLANT
DECANTER SPIKE VIAL GLASS SM (MISCELLANEOUS) ×3 IMPLANT
DERMABOND ADHESIVE PROPEN (GAUZE/BANDAGES/DRESSINGS) ×2
DERMABOND ADVANCED (GAUZE/BANDAGES/DRESSINGS) ×2
DERMABOND ADVANCED .7 DNX12 (GAUZE/BANDAGES/DRESSINGS) ×1 IMPLANT
DERMABOND ADVANCED .7 DNX6 (GAUZE/BANDAGES/DRESSINGS) ×1 IMPLANT
DRAPE LAPAROTOMY 100X72 PEDS (DRAPES) ×3 IMPLANT
DRAPE MICROSCOPE LEICA (MISCELLANEOUS) IMPLANT
DRAPE POUCH INSTRU U-SHP 10X18 (DRAPES) ×3 IMPLANT
DRILL BIT POWER (BIT) ×2
DRILL NEURO 2X3.1 SOFT TOUCH (MISCELLANEOUS) ×3
DURAPREP 6ML APPLICATOR 50/CS (WOUND CARE) ×3 IMPLANT
ELECT REM PT RETURN 9FT ADLT (ELECTROSURGICAL) ×3
ELECTRODE REM PT RTRN 9FT ADLT (ELECTROSURGICAL) ×1 IMPLANT
GAUZE SPONGE 4X4 12PLY STRL (GAUZE/BANDAGES/DRESSINGS) ×3 IMPLANT
GAUZE SPONGE 4X4 16PLY XRAY LF (GAUZE/BANDAGES/DRESSINGS) IMPLANT
GLOVE BIOGEL PI IND STRL 7.5 (GLOVE) ×1 IMPLANT
GLOVE BIOGEL PI IND STRL 8.5 (GLOVE) ×1 IMPLANT
GLOVE BIOGEL PI INDICATOR 7.5 (GLOVE) ×2
GLOVE BIOGEL PI INDICATOR 8.5 (GLOVE) ×2
GLOVE ECLIPSE 6.5 STRL STRAW (GLOVE) ×3 IMPLANT
GLOVE ECLIPSE 8.5 STRL (GLOVE) ×3 IMPLANT
GLOVE EXAM NITRILE LRG STRL (GLOVE) IMPLANT
GLOVE EXAM NITRILE MD LF STRL (GLOVE) IMPLANT
GLOVE EXAM NITRILE XL STR (GLOVE) IMPLANT
GLOVE EXAM NITRILE XS STR PU (GLOVE) IMPLANT
GLOVE SS N UNI LF 7.0 STRL (GLOVE) ×9 IMPLANT
GOWN STRL REUS W/ TWL LRG LVL3 (GOWN DISPOSABLE) ×2 IMPLANT
GOWN STRL REUS W/ TWL XL LVL3 (GOWN DISPOSABLE) IMPLANT
GOWN STRL REUS W/TWL 2XL LVL3 (GOWN DISPOSABLE) ×3 IMPLANT
GOWN STRL REUS W/TWL LRG LVL3 (GOWN DISPOSABLE) ×4
GOWN STRL REUS W/TWL XL LVL3 (GOWN DISPOSABLE)
HALTER HD/CHIN CERV TRACTION D (MISCELLANEOUS) ×3 IMPLANT
KIT BASIN OR (CUSTOM PROCEDURE TRAY) ×3 IMPLANT
KIT ROOM TURNOVER OR (KITS) ×3 IMPLANT
NEEDLE HYPO 22GX1.5 SAFETY (NEEDLE) ×3 IMPLANT
NEEDLE SPNL 22GX3.5 QUINCKE BK (NEEDLE) ×3 IMPLANT
NS IRRIG 1000ML POUR BTL (IV SOLUTION) ×3 IMPLANT
PACK LAMINECTOMY NEURO (CUSTOM PROCEDURE TRAY) ×3 IMPLANT
PAD ARMBOARD 7.5X6 YLW CONV (MISCELLANEOUS) ×9 IMPLANT
PLATE ARCHON 2-LEVEL 38MM (Plate) ×3 IMPLANT
RUBBERBAND STERILE (MISCELLANEOUS) IMPLANT
SCREW ARCHON SELFTAP 4.0X13 (Screw) ×18 IMPLANT
SPONGE INTESTINAL PEANUT (DISPOSABLE) ×3 IMPLANT
SPONGE SURGIFOAM ABS GEL SZ50 (HEMOSTASIS) ×3 IMPLANT
SUT VIC AB 3-0 SH 8-18 (SUTURE) ×6 IMPLANT
SYR 20ML ECCENTRIC (SYRINGE) ×3 IMPLANT
TOWEL OR 17X24 6PK STRL BLUE (TOWEL DISPOSABLE) ×3 IMPLANT
TOWEL OR 17X26 10 PK STRL BLUE (TOWEL DISPOSABLE) ×3 IMPLANT
WATER STERILE IRR 1000ML POUR (IV SOLUTION) ×3 IMPLANT

## 2013-12-12 NOTE — Progress Notes (Signed)
Patient ID: Carmen Deleon, female   DOB: [birth year], 58 y.o.   MRN: [id number] Alert but currently complaining of nausea. Patient apparently received some Dilaudid during immediate postop period she states she is allergic to Dilaudid and that this causes nausea but now she says it also makes her itch. She is currently not itchy but complains of nausea. They reaction to postanesthesia or to the narcotic itself am not certain that she is experiencing allergic reaction at this time. In any event patient appears fairly anxious about this situation.  Motor function appears to be doing good incisions clean and dry. Anesthesia has spent time discussing her situation with her airway is not compromised and patient otherwise appears stable.

## 2013-12-12 NOTE — Progress Notes (Signed)
Patient alert and oriented and still c/o nausea and feeling of unable to breath properly. Patient is on 2L oxygen via [place] with stats of 100%. RN comforted patient and explain to her she has oxygen via [place] but patient stated "I am afraid to sleep because it feels like I cannot breath when I closed my eyes". Patient's family at bedside, and requested for patient to in a bigger unit with more people for monitoring. Patient transferred to [place]. Tomma Rakers.RN.

## 2013-12-12 NOTE — Anesthesia Postprocedure Evaluation (Signed)
Anesthesia Post Note  Patient: Carmen Deleon  Procedure(s) Performed: Procedure(s) (LRB): ANTERIOR CERVICAL DECOMPRESSION/DISCECTOMY FUSION 2 LEVELS (N/A)  Anesthesia type: general  Patient location: PACU  Post pain: Pain level controlled  Post assessment: Patient's Cardiovascular Status Stable  Last Vitals:  Filed Vitals:   12/12/13 1440  BP: 144/70  Pulse: 74  Temp: 36.4 C  Resp: 18    Post vital signs: Reviewed and stable  Level of consciousness: sedated  Complications: No apparent anesthesia complications

## 2013-12-12 NOTE — Progress Notes (Signed)
At 1415 I was asked to go to room 3C09 as the patient had received Dilaudid during her PACU recovery that was ordered by [facility]. Dilaudid is listed as an allergy as she had N&V with itching after receiving it prior. I arrived to bedside and the patient was c/o not being able to breath and that her throat was closing up. She was highly anxious, stated she was nauseated. Her color was pink and I took her vitals with O2 sat at 100% on 2L [place]. She had received Zofran for nausea before leaving PACU. Lung sounds were clear bilateral and she was taking good breaths when I got her calmed down. Dr. Conrad Severy, [facility] had also been notified and came to bedside. Dr. Conrad Dayton assessed the patient and respiratory assessment was WNL. On review of the chart the patient takes valium 4 times per day for anxiety. I requested the floor RN to get her valium that was ordered for the patient. Family to bedside, patient tolerating clear liquids, but at times she begins to get very anxious and then states she is nauseated. I remained at bedside until the patient's last dose of dilaudid had been given at least 2 hours ago. Family has left to go home, patient is more relaxed and is beginning to dose off. She states that the nausea is improved and thanked me for staying with her.

## 2013-12-12 NOTE — Anesthesia Preprocedure Evaluation (Signed)
Anesthesia Evaluation  Patient identified by MRN, date of birth, ID band Patient awake    Reviewed: Allergy & Precautions, H&P , NPO status , Patient's Chart, lab work & pertinent test results  History of Anesthesia Complications (+) PONV and DIFFICULT AIRWAY  Airway Mallampati: II TM Distance: >3 FB Neck ROM: Full    Dental   Pulmonary former smoker,          Cardiovascular     Neuro/Psych    GI/Hepatic GERD-  Medicated and Controlled,  Endo/Other    Renal/GU      Musculoskeletal   Abdominal   Peds  Hematology   Anesthesia Other Findings   Reproductive/Obstetrics                           Anesthesia Physical Anesthesia Plan  ASA: II  Anesthesia Plan: General   Post-op Pain Management:    Induction: Intravenous  Airway Management Planned: Oral ETT and Video Laryngoscope Planned  Additional Equipment:   Intra-op Plan:   Post-operative Plan: Extubation in OR  Informed Consent: I have reviewed the patients History and Physical, chart, labs and discussed the procedure including the risks, benefits and alternatives for the proposed anesthesia with the patient or authorized representative who has indicated his/her understanding and acceptance.     Plan Discussed with: CRNA and Surgeon  Anesthesia Plan Comments:         Anesthesia Quick Evaluation

## 2013-12-12 NOTE — Plan of Care (Signed)
Problem: Consults Goal: Diagnosis - Spinal Surgery Outcome: Completed/Met Date Met:  12/12/13 Cervical Spine Fusion     

## 2013-12-12 NOTE — OR Nursing (Signed)
I spoke with Joelene Millin (Pharmacist) regarding the caution indicator on the 1% Lidocaine local anesthetic on the patient's medication list. She said there was no clinical reason not to give the Lidocaine. The Lidocaine was given to the field and administered to the patient.

## 2013-12-12 NOTE — Anesthesia Procedure Notes (Signed)
Procedure Name: Intubation Date/Time: 12/12/2013 10:35 AM Performed by: Scheryl Darter Pre-anesthesia Checklist: Patient identified, Emergency Drugs available, Suction available, Patient being monitored and Timeout performed Patient Re-evaluated:Patient Re-evaluated prior to inductionOxygen Delivery Method: Circle system utilized Preoxygenation: Pre-oxygenation with 100% oxygen Intubation Type: IV induction Ventilation: Mask ventilation without difficulty Grade View: Grade II Tube type: Oral Tube size: 7.0 mm Number of attempts: 1 Airway Equipment and Method: Video-laryngoscopy Placement Confirmation: ETT inserted through vocal cords under direct vision,  positive ETCO2 and breath sounds checked- equal and bilateral Secured at: 21 cm Tube secured with: Tape Dental Injury: Teeth and Oropharynx as per pre-operative assessment  Difficulty Due To: Difficulty was anticipated Comments: Chart noted previous difficult intubation/glidescope used/prob would be easy intubation with regular scope/teeth with poor dentition noted

## 2013-12-12 NOTE — H&P (Signed)
HISTORY OF PRESENT ILLNESS:                     Carmen Deleon was seen in the office in 2013 by Dr. Earle Gell.  At that time, Dr. Arnoldo Morale did a workup of the cervical spine including MRIs that were performed prior to the visit.  These studies were reviewed today and it appears that in the cervical spine Carmen Deleon had significant cervical spondylosis at C5-6 and C6-7.  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  The syrinx was mild-to-moderate in size.   It did not appear to compress the spinal cord.  Clinically, Carmen Deleon was having left-sided symptoms along the entirety of the left side of her body including her neck, shoulder, arm, hand, the side of her chest and flank and her left lower extremity.  She describes the similar kind of pain pattern on the pain drawing today in the office.  She notes these symptoms have been continuing and she is wondering if anything could be done.  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  I reviewed those images and concur that the lumbar spine appears to be quite healthy.  Dr. Arnoldo Morale at that time then discussed surgical intervention for her outlining the surgical procedure, but Carmen Deleon ultimately had chosen to not pursue that avenue. She's been having continued difficulties with pain and feels now that the pain is worsening to the point where she want to consider and reconsider whether surgery would be appropriate.  REVIEW OF SYSTEMS:                                    Little changed.  PAST MEDICAL HISTORY:    Current Medical Conditions:  Little changed.    Medications and Allergies:  Current medications include calcium supplement, ondansetron, mirtazapine, Carafate, Crestor, voltaren gel, MiraLax powder, diazepam, Lyrica, acyclovir, menest, aspirin enteric coated,  omeprazole, and baclofen.  SHE NOTES ALLERGIES TO DILAUDID, DARVOCET, TRAMADOL, ERYTHROMYCIN, CODEINE, AND TORADOL.  SOCIAL HISTORY:                                            Little changed.  PHYSICAL EXAMINATION:                                On physical exam, I note that she limits her range of motion allowing herself only turn 30 degrees left and right.  She flexes and extends about 50% of normal.  There is tenderness to palpation of the supraclavicular fossa and the motor strength appears to have a lot of give way testing in the triceps, wrist extensor, and grip on the left side compared to the right.  Strength will be graded at 4/5.  There was no gross atrophy of any of the musculature in the upper extremities.  Her station and gait is intact.  Motor strength in lower extremities appears intact.  IMPRESSION/PLAN:  The patient appears to have mostly a syndrome of pain on the left side of her body and most notable finding is that she has advanced degenerative changes in the disc at C5-6 and C6-7.  She has some evidence of early flattening of the cord on the MRI from 2013, but no overt compression of it.  In light of the finding that she has significant syringomyelia, I believe that it will be appropriate to consider surgical decompression and arthrodesis of the two levels C5-6 and C6-7 as had been outlined by Dr. Arnoldo Morale; however, given the fact that there has been two years interval since the last imaging study I believe that the imaging study should be repeated.  I would like to see her back after she has a new MRI to make final recommendations regarding surgery.  Indeed, I have agreed with Dr. Arnoldo Morale that not all of her symptoms are likely to be improved, but there may be some certain symptoms particularly in regards to neck pain and discomfort in the upper or lower extremities that could be made better . MRI was reviewed in the office several weeks ago and this  revealed the presence of advancing spondylosis at C5-6 and C6-C7 having discussed her findings again noting that she also has single female he I suggested that she may indeed benefit from anterior cervical decompression arthrodesis has had been suggested a few years back. At this point she is resigned to have something definitive done will proceed with anterior decompression arthrodesis at C5-6 and C6-7 today.

## 2013-12-12 NOTE — Op Note (Signed)
Date of surgery: 12/12/2013 Preoperative diagnosis: Cervical spondylosis with radiculopathy C5-6 C6-C7, myelopathy, syringomyelia Post operative diagnosis: Cervical spondylosis with radiculopathy C5-6 C6-C7, myelopathy syringomyelia Procedure: Anterior cervical discectomy decompression of nerve roots and spinal canal C5-6 C6-C7 arthrodesis with structural allograft, nuvasive plate fixation [id number] Surgeon: Kristeen Miss M.D. Asst.: Dr. Dayton Bailiff M.D. Indications: Patient is a 58 year old individual is had significant problems with neck shoulder and arm pain. She has evidence of significant spondylosis at C5-6 and C6-C7 plus she has syringomyelia from this level down to the mid thoracic spine. She's been dealing with these symptoms for a long period time and a few years ago is advised regarding consideration of surgery. She deferred this but now her symptoms are such that she feels something definitive needs to be done.   Procedure: The patient was brought to the operating room placed on the table in supine position. After the smooth induction of general endotracheal anesthesia neck was placed in 5 pounds of halter traction and prepped with alcohol and DuraPrep. After sterile draping and appropriate timeout procedure a transverse incision was created in the left side of the neck and carried down to the platysma. The plane between the sternocleidomastoid and strap muscles dissected bluntly until the prevertebral space was reached. The first identifiable disc space was noted to be C5-C6 on a localizing radiograph. The dissection was then undertaken in the longus coli muscle to allow placement of a self-retaining Caspar type retractor.  The anterior longitudinal ligament was opened at C5-C6 and C6-C7 and ventral osteophytes were removed with a Leksell rongeur and Kerrison punch. Interspace was cleared of significant quantity of the degenerated disc material in the region of the posterior longitudinal ligament  was removed. Dissection was carried out using a high-speed drill and 3-0 Karlin curettes. Uncinate processes were drilled down and removed and osteophytes from the inferior margin of the body of C6 were removed with a Kerrison 2 mm gold punch. After the central canal and lateral recesses were well decompressed hemostasis was achieved with the bipolar cautery and some small pledgets of Gelfoam soaked in thrombin that were later irrigated away.  A 5 mm cortical ring allograft was then placed at C6-7 into the interspace. C5-6 Was decompressed and fused in a similar fashion.  Next the retractor was removed and a 38 mm nuvasive  plate was placed over the vertebral bodies and secured with 13 mm variable angle screws. A final localizing radiograph identified the position of the surgical construct. The stasis was achieved in the soft tissues and then the platysma was closed with 3-0 Vicryl in an interrupted fashion and 3-0 Vicryl was used in the subcuticular tissue. Blood loss was estimated at 75 cc

## 2013-12-12 NOTE — Transfer of Care (Signed)
Immediate Anesthesia Transfer of Care Note  Patient: Carmen Deleon  Procedure(s) Performed: Procedure(s) with comments: ANTERIOR CERVICAL DECOMPRESSION/DISCECTOMY FUSION 2 LEVELS (N/A) - C5-6 C6-7 Anterior cervical decompression/diskectomy/fusion  Patient Location: PACU  Anesthesia Type:General  Level of Consciousness: awake  Airway & Oxygen Therapy: Patient Spontanous Breathing  Post-op Assessment: Report given to PACU RN and Post -op Vital signs reviewed and stable  Post vital signs: Reviewed and stable  Complications: No apparent anesthesia complications

## 2013-12-13 DIAGNOSIS — E785 Hyperlipidemia, unspecified: Secondary | ICD-10-CM | POA: Diagnosis present

## 2013-12-13 DIAGNOSIS — K3184 Gastroparesis: Secondary | ICD-10-CM | POA: Diagnosis present

## 2013-12-13 DIAGNOSIS — G950 Syringomyelia and syringobulbia: Secondary | ICD-10-CM | POA: Diagnosis present

## 2013-12-13 DIAGNOSIS — K219 Gastro-esophageal reflux disease without esophagitis: Secondary | ICD-10-CM | POA: Diagnosis present

## 2013-12-13 DIAGNOSIS — F419 Anxiety disorder, unspecified: Secondary | ICD-10-CM | POA: Diagnosis present

## 2013-12-13 DIAGNOSIS — Z885 Allergy status to narcotic agent status: Secondary | ICD-10-CM | POA: Diagnosis not present

## 2013-12-13 DIAGNOSIS — M797 Fibromyalgia: Secondary | ICD-10-CM | POA: Diagnosis present

## 2013-12-13 DIAGNOSIS — Z888 Allergy status to other drugs, medicaments and biological substances status: Secondary | ICD-10-CM | POA: Diagnosis not present

## 2013-12-13 DIAGNOSIS — Z8673 Personal history of transient ischemic attack (TIA), and cerebral infarction without residual deficits: Secondary | ICD-10-CM | POA: Diagnosis not present

## 2013-12-13 DIAGNOSIS — M2578 Osteophyte, vertebrae: Secondary | ICD-10-CM | POA: Diagnosis present

## 2013-12-13 DIAGNOSIS — Z9889 Other specified postprocedural states: Secondary | ICD-10-CM | POA: Diagnosis present

## 2013-12-13 DIAGNOSIS — R112 Nausea with vomiting, unspecified: Secondary | ICD-10-CM | POA: Diagnosis present

## 2013-12-13 DIAGNOSIS — Z7982 Long term (current) use of aspirin: Secondary | ICD-10-CM | POA: Diagnosis not present

## 2013-12-13 DIAGNOSIS — Z79899 Other long term (current) drug therapy: Secondary | ICD-10-CM | POA: Diagnosis not present

## 2013-12-13 DIAGNOSIS — M4712 Other spondylosis with myelopathy, cervical region: Secondary | ICD-10-CM | POA: Diagnosis present

## 2013-12-13 DIAGNOSIS — T8189XA Other complications of procedures, not elsewhere classified, initial encounter: Secondary | ICD-10-CM | POA: Diagnosis present

## 2013-12-13 DIAGNOSIS — M1990 Unspecified osteoarthritis, unspecified site: Secondary | ICD-10-CM | POA: Diagnosis present

## 2013-12-13 DIAGNOSIS — M5412 Radiculopathy, cervical region: Secondary | ICD-10-CM | POA: Diagnosis present

## 2013-12-13 MED ORDER — ESTERIFIED ESTROGENS 0.625 MG PO TABS
0.6250 mg | ORAL_TABLET | Freq: Every day | ORAL | Status: DC
  Administered 2013-12-14: 0.625 mg via ORAL
  Filled 2013-12-13 (×3): qty 1

## 2013-12-13 MED ORDER — SODIUM CHLORIDE 0.9 % IV SOLN
250.0000 mL | INTRAVENOUS | Status: DC
  Administered 2013-12-13 – 2013-12-16 (×4): 250 mL via INTRAVENOUS

## 2013-12-13 MED ORDER — PROMETHAZINE HCL 25 MG/ML IJ SOLN
25.0000 mg | INTRAMUSCULAR | Status: DC | PRN
  Administered 2013-12-13 – 2013-12-15 (×5): 25 mg via INTRAMUSCULAR
  Filled 2013-12-13 (×6): qty 1

## 2013-12-13 NOTE — Progress Notes (Addendum)
Patient had nausea and vomitted once this shift was given prn medication (Zofran 4mg ) was helpful briefly.  Requested phenergan called on-call service awaiting orders.  Patient having dry heaving episodes x3 this shift.  Patient states head and neck hurts "I see 2 floating sanitizer bottles", was given prn pain medication is calmly laying in bed at this time.

## 2013-12-13 NOTE — Progress Notes (Signed)
UR completed 

## 2013-12-13 NOTE — Evaluation (Signed)
Occupational Therapy Evaluation Patient Details Name: Carmen Deleon MRN: [id number] DOB: August 27, 1955 Today's Date: 12/13/2013    History of Present Illness 58 y.o. ANTERIOR CERVICAL DECOMPRESSION/DISCECTOMY FUSION 2 LEVELS   Clinical Impression    Pt s/p above. Pt independent with ADLs, PTA. Feel pt will benefit from acute OT to increase independence prior to d/c. Recommending HHOT/24 hour supervision/assistance at d/c.     Follow Up Recommendations  Home health OT;Supervision/Assistance - 24 hour    Equipment Recommendations  3 in 1 bedside comode;Tub/shower bench    Recommendations for Other Services       Precautions / Restrictions Precautions Precautions: Cervical;Fall Precaution Comments: reviewed precautions; handout given Restrictions Weight Bearing Restrictions: No      Mobility Bed Mobility Overal bed mobility: Needs Assistance Bed Mobility: Rolling;Sidelying to Sit;Sit to Sidelying Rolling: Min assist Sidelying to sit: Mod assist     Sit to sidelying: Min guard General bed mobility comments: cues for technique. Assist to lift trunk to sit EOB.  Transfers Overall transfer level: Needs assistance   Transfers: Sit to/from Stand Sit to Stand: Min guard         General transfer comment: cues for technique.    Balance                                            ADL Overall ADL's : Needs assistance/impaired     Grooming: Brushing hair;Min guard;Standing           Upper Body Dressing : Set up;Sitting;Supervision/safety   Lower Body Dressing: Minimal assistance;With adaptive equipment;Sit to/from stand   Toilet Transfer: Minimal assistance;Ambulation;RW (ambulated with and without walker; bed)           Functional mobility during ADLs: Minimal assistance;Min guard;Rolling walker (ambulated with and without walker)   ADL comments: Educated on tub bench and technique. Educated on AE for LB ADLs and pt practiced with reacher  and sockaid. Educated on use of cup/straw to help prevent neck motion. Discussed UB clothing to help maintain precautions. Discussed use of bag on walker to carry items and having someone pick up cords in house.      Vision                     Perception     Praxis      Pertinent Vitals/Pain Pain Assessment: 0-10 Pain Score: 10-Worst pain ever Pain Location: head and neck Pain Descriptors / Indicators: Aching Pain Intervention(s): Monitored during session;Repositioned     Hand Dominance     Extremity/Trunk Assessment Upper Extremity Assessment Upper Extremity Assessment: RUE deficits/detail;LUE deficits/detail RUE Deficits / Details: weak grasp with left weaker than right LUE Deficits / Details: weak grasp with left weaker than right   Lower Extremity Assessment Lower Extremity Assessment: Defer to PT evaluation       Communication Communication Communication: No difficulties   Cognition Arousal/Alertness: Awake/alert Behavior During Therapy: WFL for tasks assessed/performed Overall Cognitive Status: Within Functional Limits for tasks assessed                     General Comments       Exercises       Shoulder Instructions      Home Living Family/patient expects to be discharged to:: Private residence Living Arrangements: Spouse/significant other Available Help at Discharge: Family;Other (Comment) (spouse not in good health)  Type of Home: House Home Access: Stairs to enter [employer] of Steps: 2 Entrance Stairs-Rails: Left Home Layout: One level     Bathroom Shower/Tub: Teacher, early years/pre: Standard                Prior Functioning/Environment Level of Independence: Needs assistance    ADL's / Homemaking Assistance Needed: assistance with LB dressing.        OT Diagnosis: Acute pain   OT Problem List: Decreased strength;Impaired balance (sitting and/or standing);Decreased range of motion;Decreased  activity tolerance;Decreased knowledge of use of DME or AE;Decreased knowledge of precautions;Pain   OT Treatment/Interventions: Self-care/ADL training;Therapeutic exercise;DME and/or AE instruction;Therapeutic activities;Patient/family education;Balance training    OT Goals(Current goals can be found in the care plan section) Acute Rehab OT Goals Patient Stated Goal: not stated OT Goal Formulation: With patient Time For Goal Achievement: 12/20/13 Potential to Achieve Goals: Good ADL Goals Pt Will Perform Grooming: with modified independence;standing Pt Will Perform Lower Body Dressing: with modified independence;sit to/from stand;with adaptive equipment Pt Will Transfer to Toilet: with modified independence;ambulating Pt Will Perform Toileting - Clothing Manipulation and hygiene: with modified independence;sit to/from stand Pt Will Perform Tub/Shower Transfer: Tub transfer;tub bench;rolling walker Additional ADL Goal #1: Pt will perform HEP for bilateral hands to increase strength.   OT Frequency: Min 2X/week   Barriers to D/C: Decreased caregiver support          Co-evaluation              End of Session Equipment Utilized During Treatment: Gait belt;Rolling walker Nurse Communication: Mobility status;Other (comment) (pain level)  Activity Tolerance: Patient tolerated treatment well Patient left: in bed;with call bell/phone within reach   Time: 0934-1001 OT Time Calculation (min): 27 min Charges:  OT General Charges $OT Visit: 1 Procedure OT Evaluation $Initial OT Evaluation Tier I: 1 Procedure OT Treatments $Self Care/Home Management : 8-22 mins G-Codes: OT G-codes **NOT FOR INPATIENT CLASS** Functional Assessment Tool Used: clinical judgment Functional Limitation: Self care Self Care Current Status (P5361): At least 20 percent but less than 40 percent impaired, limited or restricted Self Care Goal Status (W4315): At least 1 percent but less than 20 percent  impaired, limited or restricted  Benito Mccreedy OTR/L [phone] 12/13/2013, 10:43 AM

## 2013-12-13 NOTE — Progress Notes (Signed)
Subjective: Patient reports Patient is having continued nausea and vomiting slightly retching dry heaves. She feels this is related to dose of Dilaudid she had yesterday.  Objective: Vital signs in last 24 hours: Temp:  [94.3 F (34.6 C)-98.2 F (36.8 C)] 98.2 F (36.8 C) (10/13 1054) Pulse Rate:  [66-85] 85 (10/13 1054) Resp:  [12-46] 18 (10/13 1054) BP: (96-144)/(53-85) 96/53 mmHg (10/13 1054) SpO2:  [96 %-100 %] 96 % (10/13 1054) Weight:  [66.1 kg (145 lb 11.6 oz)] 66.1 kg (145 lb 11.6 oz) (10/12 2016)  Intake/Output from previous day: 10/12 0701 - 10/13 0700 In: 1040 [P.O.:240; I.V.:800] Out: 300 [Emesis/NG output:250; Blood:50] Intake/Output this shift:    Incision is clean and dry slight swelling of incision with minimal ecchymosis. Motor function is intact in upper extremities station and gait are intact.  Lab Results: No results found for this basename: WBC, HGB, HCT, PLT,  in the last 72 hours BMET No results found for this basename: NA, K, CL, CO2, GLUCOSE, BUN, CREATININE, CALCIUM,  in the last 72 hours  Studies/Results: Dg Cervical Spine 2-3 Views  12/12/2013   CLINICAL DATA:  Anterior surgical fusion of C5-6. Herniated disc in cervical spine.  EXAM: CERVICAL SPINE - 2-3 VIEW  COMPARISON:  MRI scan of November 22, 2013.  FINDINGS: Two intraoperative lateral projections of the cervical spine were submitted for review. The first image demonstrates surgical probe localizing the anterior portion of the C5-6 disc space. The second image demonstrates the patient to be status post surgical anterior fusion of C5, C6 and C7 with interbody spacers.  IMPRESSION: Surgical anterior fusion of C5, C6 and C7.   Electronically Signed   By: Sabino Dick M.D.   On: 12/12/2013 12:32    Assessment/Plan: Persistent nausea and some retching postoperatively.  LOS: 1 day  Continue IV fluid hydration. Mirtazapine helps patient with nausea.   Aniken Monestime J 12/13/2013, 11:37 AM

## 2013-12-13 NOTE — Progress Notes (Signed)
PT Cancellation Note  Patient Details Name: Carmen Deleon MRN: [id number] DOB: 1955/05/04   Cancelled Treatment:    Reason Eval/Treat Not Completed: Medical issues which prohibited therapy (pt nauseous). Will follow.    Blondell Reveal Kistler 12/13/2013, 10:53 AM [phone]

## 2013-12-14 ENCOUNTER — Encounter (HOSPITAL_COMMUNITY): Payer: Self-pay | Admitting: Neurological Surgery

## 2013-12-14 NOTE — Evaluation (Signed)
Physical Therapy Evaluation Patient Details Name: Carmen Deleon MRN: [id number] DOB: December 08, 1955 Today's Date: 12/14/2013   History of Present Illness  58 y.o. female admitted to [facility] on 12/12/13 for elective C5/6, C6/7 ACDF.  Pt with significant PMHx of depression/anxiety, AC joint spurs, dysphasia (per chart cannot swallow large pills and bread),  TIA, and fibromyalgia.   Clinical Impression  Pt is progressing well with her mobility. Min assist overall for gait with RW.  She is limited by pain and nausea, but continues to get up and mobilize with assist.  Education reviewed and will need to be reinforced.  Pt would benefit from HHPT at discharge if MD agrees and she would qualify.  RW needed for safety during gait.  PT to follow acutely for deficits listed below.       Follow Up Recommendations Home health PT    Equipment Recommendations  Rolling walker with 5" wheels    Recommendations for Other Services   NA    Precautions / Restrictions Precautions Precautions: Cervical;Fall Precaution Comments: handout reviewed, precautions, lifting restrictions, and recommendation to walk TID reviewed with pt.  Restrictions Weight Bearing Restrictions: No      Mobility  Bed Mobility Overal bed mobility: Needs Assistance Bed Mobility: Rolling;Sidelying to Sit Rolling: Min assist Sidelying to sit: Min assist       General bed mobility comments: Min assist to support trunk during transitions.  Verbal cues for log roll technique  Transfers Overall transfer level: Needs assistance Equipment used: Rolling walker (2 wheeled) Transfers: Sit to/from Stand Sit to Stand: Min guard         General transfer comment: Min guard assist for safety. Verbal cues for safe hand placement.   Ambulation/Gait Ambulation/Gait assistance: Min assist Ambulation Distance (Feet): 45 Feet Assistive device: Rolling walker (2 wheeled) Gait Pattern/deviations: Step-through pattern;Antalgic;Trunk  flexed Gait velocity: decreased Gait velocity interpretation: <1.8 ft/sec, indicative of risk for recurrent falls General Gait Details: Verbal cues to stay inside of RW, upright posture.  Pt self cuing for shoulders relaxed and down.  Min assist needed to support trunk during gait as she has an abnormal antalgic gait pattern. Pt vomited x 1 during gait, so brough emesis bag with [place].          Balance Overall balance assessment: Needs assistance Sitting-balance support: Feet supported;No upper extremity supported Sitting balance-Leahy Scale: Good     Standing balance support: No upper extremity supported;Bilateral upper extremity supported;Single extremity supported Standing balance-Leahy Scale: Fair                               Pertinent Vitals/Pain Pain Assessment: 0-10 Pain Score: 8  Pain Location: incision Pain Descriptors / Indicators: Aching;Burning;Constant Pain Intervention(s): Limited activity within patient's tolerance;Monitored during session;Premedicated before session;Repositioned    Home Living Family/patient expects to be discharged to:: Private residence Living Arrangements: Spouse/significant other Available Help at Discharge: Family;Other (Comment) (spouse not in good health) Type of Home: House Home Access: Stairs to enter Entrance Stairs-Rails: Left Entrance Stairs-Number of Steps: 2 Home Layout: One level Home Equipment: None (of her own)      Prior Function Level of Independence: Needs assistance   Gait / Transfers Assistance Needed: pt is independent with gait  ADL's / Homemaking Assistance Needed: assistance with LB dressing.           Extremity/Trunk Assessment   Upper Extremity Assessment: Defer to OT evaluation  Lower Extremity Assessment: LLE deficits/detail   LLE Deficits / Details: per pt left leg and left "side" has had issues for a long time.  She reports left hip bursitis, but if it is her left side I am  wondering if it is not realted to her h/o TIA as well.  Functionally, pt has antalgic gait pattern, flexed posture.   Cervical / Trunk Assessment: Other exceptions  Communication   Communication: No difficulties  Cognition Arousal/Alertness: Awake/alert Behavior During Therapy: WFL for tasks assessed/performed Overall Cognitive Status: Within Functional Limits for tasks assessed                               Assessment/Plan    PT Assessment Patient needs continued PT services  PT Diagnosis Difficulty walking;Abnormality of gait;Generalized weakness;Acute pain   PT Problem List Decreased strength;Decreased activity tolerance;Decreased balance;Decreased mobility;Decreased knowledge of use of DME;Decreased knowledge of precautions;Pain  PT Treatment Interventions DME instruction;Stair training;Gait training;Functional mobility training;Therapeutic activities;Therapeutic exercise;Balance training;Neuromuscular re-education;Patient/family education;Modalities   PT Goals (Current goals can be found in the Care Plan section) Acute Rehab PT Goals Patient Stated Goal: to be able to go home PT Goal Formulation: With patient Time For Goal Achievement: 12/21/13 Potential to Achieve Goals: Good    Frequency Min 5X/week   Barriers to discharge Decreased caregiver support pt's husband is disabled and unable to provide any physical assist at discharge.        End of Session Equipment Utilized During Treatment: Gait belt Activity Tolerance: Patient limited by fatigue;Patient limited by pain Patient left: in chair;with call bell/phone within reach;with chair alarm set           Time: 1610-9604 PT Time Calculation (min): 31 min   Charges:   PT Evaluation $Initial PT Evaluation Tier I: 1 Procedure PT Treatments $Gait Training: 8-22 mins        Keishawn Darsey B. Jamye Balicki, PT, DPT [phone]   12/14/2013, 10:54 AM

## 2013-12-14 NOTE — Progress Notes (Signed)
Occupational Therapy Treatment Patient Details Name: Carmen Deleon MRN: [id number] DOB: November 17, 1955 Today's Date: 12/14/2013    History of present illness 58 y.o. female admitted to [facility] on 12/12/13 for elective C5/6, C6/7 ACDF.  Pt with significant PMHx of depression/anxiety, AC joint spurs, dysphasia (per chart cannot swallow large pills and bread),  TIA, and fibromyalgia.    OT comments  Pt progressing. Education provided. Pt nauseous in session.  Follow Up Recommendations  Home health OT;Supervision/Assistance - 24 hour    Equipment Recommendations  3 in 1 bedside comode;Tub/shower bench;Other (comment) (AE)    Recommendations for Other Services      Precautions / Restrictions Precautions Precautions: Cervical;Fall Precaution Comments: reviewed precautions Restrictions Weight Bearing Restrictions: No       Mobility Bed Mobility Overal bed mobility: Needs Assistance Bed Mobility: Sit to Sidelying;Rolling Rolling: Supervision     Sit to sidelying: Min assist General bed mobility comments: Min A with leg. Cues to reinforce technique.  Transfers Overall transfer level: Needs assistance Equipment used: Rolling walker (2 wheeled) Transfers: Sit to/from Stand Sit to Stand: Min guard         General transfer comment: Min guard for safety.        ADL Overall ADL's : Needs assistance/impaired             Lower Body Bathing: Min guard (standing-washed buttocks/peri area)       Lower Body Dressing: Min guard;Sit to/from stand;With adaptive equipment (donned panties)   Toilet Transfer: Min guard;Minimal assistance;Ambulation;RW (bed/chair)       Tub/ Shower Transfer: Minimal assistance;Ambulation;Tub bench;Rolling walker   Functional mobility during ADLs: Min guard;Minimal assistance;Rolling walker General ADL Comments: Educated on tub transfer and practiced using tub bench. Reviewed safety. Gave pt theraputty to help strengthen hands. Reviewed to use cup  for teeth care. Practiced using reacher to don panties and reviewed technique. Practiced sidestepping around obstacles in gym when going to/from tub. Discussed using long sponge or crossing legs for LB bathing.      Vision                     Perception     Praxis      Cognition  Awake/Alert Behavior During Therapy: WFL for tasks assessed/performed Overall Cognitive Status: Within Functional Limits for tasks assessed                       Extremity/Trunk Assessment  Upper Extremity Assessment Upper Extremity Assessment: Defer to OT evaluation   Lower Extremity Assessment Lower Extremity Assessment: LLE deficits/detail LLE Deficits / Details: per pt left leg and left "side" has had issues for a long time.  She reports left hip bursitis, but if it is her left side I am wondering if it is not realted to her h/o TIA as well.  Functionally, pt has antalgic gait pattern, flexed posture.    Cervical / Trunk Assessment Cervical / Trunk Assessment: Other exceptions Cervical / Trunk Exceptions: per pt she has sacral cysts, flexed forward head posture in sitting and standing.   Mild kyphosis    Exercises     Shoulder Instructions       General Comments      Pertinent Vitals/ Pain       Pain Assessment: 0-10 Pain Score: 9  Pain Location: neck, shoulders, back Pain Descriptors / Indicators: Aching;Burning;Constant Pain Intervention(s): Monitored during session;Repositioned  Home Living Family/patient expects to be discharged to:: Private residence Living Arrangements: Spouse/significant  other Available Help at Discharge: Family;Other (Comment) (spouse not in good health) Type of Home: House Home Access: Stairs to enter [employer] of Steps: 2 Entrance Stairs-Rails: Left Home Layout: One level               Home Equipment: None (of her own)          Prior Functioning/Environment Level of Independence: Needs assistance  Gait / Transfers  Assistance Needed: pt is independent with gait ADL's / Homemaking Assistance Needed: assistance with LB dressing. Communication / Swallowing Assistance Needed: per pt, at baseline she has dysphasia     Frequency Min 2X/week     Progress Toward Goals  OT Goals(current goals can now be found in the care plan section)  Progress towards OT goals: Progressing toward goals  Acute Rehab OT Goals Patient Stated Goal: not stated OT Goal Formulation: With patient Time For Goal Achievement: 12/20/13 Potential to Achieve Goals: Good ADL Goals Pt Will Perform Grooming: with modified independence;standing Pt Will Perform Lower Body Dressing: with modified independence;sit to/from stand;with adaptive equipment Pt Will Transfer to Toilet: with modified independence;ambulating Pt Will Perform Toileting - Clothing Manipulation and hygiene: with modified independence;sit to/from stand Pt Will Perform Tub/Shower Transfer: Tub transfer;tub bench;rolling walker Additional ADL Goal #1: Pt will perform HEP for bilateral hands to increase strength.   Plan Discharge plan remains appropriate    Co-evaluation                 End of Session Equipment Utilized During Treatment: Gait belt;Rolling walker   Activity Tolerance Patient tolerated treatment well (nauseous throughout but did well)   Patient Left in bed;with call bell/phone within reach;with bed alarm set   Nurse Communication          Time: 4944-9675 OT Time Calculation (min): 30 min  Charges: OT General Charges $OT Visit: 1 Procedure OT Treatments $Self Care/Home Management : 8-22 mins $Therapeutic Activity: 8-22 mins  Benito Mccreedy OTR/L [phone] 12/14/2013, 1:47 PM

## 2013-12-14 NOTE — Progress Notes (Signed)
Subjective: Patient reports still with nausea and retching though reprtedly taking some pos  Objective: Vital signs in last 24 hours: Temp:  [98.1 F (36.7 C)-98.8 F (37.1 C)] 98.4 F (36.9 C) (10/14 1351) Pulse Rate:  [77-97] 97 (10/14 1351) Resp:  [18-20] 18 (10/14 1351) BP: (89-118)/(55-66) 110/63 mmHg (10/14 1351) SpO2:  [96 %-98 %] 98 % (10/14 1351)  Intake/Output from previous day: 10/13 0701 - 10/14 0700 In: 360 [P.O.:360] Out: -  Intake/Output this shift: Total I/O In: 123 [P.O.:120; I.V.:3] Out: -   incision clean and  dry. Looks dejected and sad.  Lab Results: No results found for this basename: WBC, HGB, HCT, PLT,  in the last 72 hours BMET No results found for this basename: NA, K, CL, CO2, GLUCOSE, BUN, CREATININE, CALCIUM,  in the last 72 hours  Studies/Results: No results found.  Assessment/Plan: Stable awaiting sessation of nausea   LOS: 2 days  Continue IV fluid suplementation and antinausea meds.   Carmen Deleon J 12/14/2013, 6:13 PM

## 2013-12-15 LAB — CBC WITH DIFFERENTIAL/PLATELET
Basophils Absolute: 0 10*3/uL (ref 0.0–0.1)
Basophils Relative: 0 % (ref 0–1)
Eosinophils Absolute: 0 10*3/uL (ref 0.0–0.7)
Eosinophils Relative: 1 % (ref 0–5)
HEMATOCRIT: 39 % (ref 36.0–46.0)
HEMOGLOBIN: 13 g/dL (ref 12.0–15.0)
Lymphocytes Relative: 32 % (ref 12–46)
Lymphs Abs: 2.4 10*3/uL (ref 0.7–4.0)
MCH: 30.7 pg (ref 26.0–34.0)
MCHC: 33.3 g/dL (ref 30.0–36.0)
MCV: 92.2 fL (ref 78.0–100.0)
MONO ABS: 0.7 10*3/uL (ref 0.1–1.0)
MONOS PCT: 10 % (ref 3–12)
Neutro Abs: 4.3 10*3/uL (ref 1.7–7.7)
Neutrophils Relative %: 57 % (ref 43–77)
Platelets: 204 10*3/uL (ref 150–400)
RBC: 4.23 MIL/uL (ref 3.87–5.11)
RDW: 13 % (ref 11.5–15.5)
WBC: 7.5 10*3/uL (ref 4.0–10.5)

## 2013-12-15 LAB — BASIC METABOLIC PANEL
ANION GAP: 11 (ref 5–15)
BUN: 7 mg/dL (ref 6–23)
CHLORIDE: 102 meq/L (ref 96–112)
CO2: 27 mEq/L (ref 19–32)
Calcium: 9 mg/dL (ref 8.4–10.5)
Creatinine, Ser: 0.73 mg/dL (ref 0.50–1.10)
GFR calc Af Amer: >90 mL/min (ref >90)
GFR calc non Af Amer: >90 mL/min (ref >90)
Glucose, Bld: 86 mg/dL (ref 70–99)
POTASSIUM: 3.9 meq/L (ref 3.7–5.3)
SODIUM: 140 meq/L (ref 137–147)

## 2013-12-15 MED ORDER — ESTERIFIED ESTROGENS 0.625 MG PO TABS
0.6250 mg | ORAL_TABLET | Freq: Every day | ORAL | Status: DC
  Administered 2013-12-15 – 2013-12-19 (×5): 0.625 mg via ORAL
  Filled 2013-12-15 (×3): qty 1

## 2013-12-15 NOTE — Progress Notes (Signed)
Patient ID: Carmen Deleon, female   DOB: 21-Aug-1955, 58 y.o.   MRN: [id number] Still c/o nausea and vomitting... More wretching than gross vomit.Nurses have only witnessed small volumes of spit up always clear... Now day 3 post op... I will ask GI to see and have spoken with Dr Cristina Gong. CBC and BMET ordered.

## 2013-12-15 NOTE — Progress Notes (Signed)
Physical Therapy Treatment Patient Details Name: Carmen Deleon MRN: [id number] DOB: [birth year] Today's Date: 12/15/2013    History of Present Illness 58 y.o. female admitted to [facility] on 12/12/13 for elective C5/6, C6/7 ACDF.  Pt with significant PMHx of depression/anxiety, AC joint spurs, dysphasia (per chart cannot swallow large pills and bread),  TIA, and fibromyalgia.     PT Comments    Pt is progressing well with her mobility and is min guard assist with RW for longer distance gait today.  She continues to need to be encouraged to maintain upright posture during gait.  Pt continues to be appropriate for HHPT and RW at discharge.  PT to follow acutely.   Follow Up Recommendations  Home health PT     Equipment Recommendations  Rolling walker with 5" wheels    Recommendations for Other Services   NA     Precautions / Restrictions Precautions Precautions: Cervical;Fall    Mobility  Bed Mobility Overal bed mobility: Needs Assistance Bed Mobility: Rolling;Sidelying to Sit Rolling: Min assist Sidelying to sit: Min assist       General bed mobility comments: Min assist to support truk and pelvis during transitions.  Pt needed verbal cues for log roll technique.   Transfers Overall transfer level: Needs assistance Equipment used: Rolling walker (2 wheeled) Transfers: Sit to/from Stand Sit to Stand: Min guard         General transfer comment: Min guard assist for safety during transition to stand.  Pt slow to transition with heavy reliance on hands and multiple attempts to get to standing.   Ambulation/Gait Ambulation/Gait assistance: Min guard Ambulation Distance (Feet): 150 Feet Assistive device: Rolling walker (2 wheeled) Gait Pattern/deviations: Step-through pattern;Trunk flexed (shoulders elevated) Gait velocity: decreased Gait velocity interpretation: Below normal speed for age/gender General Gait Details: Pt self cues to relax shoulders, but needs verbal cues  for upright posture.           Balance Overall balance assessment: Needs assistance Sitting-balance support: Feet supported;No upper extremity supported Sitting balance-Leahy Scale: Good     Standing balance support: Bilateral upper extremity supported;No upper extremity supported;Single extremity supported Standing balance-Leahy Scale: Fair                      Cognition Arousal/Alertness: Lethargic Behavior During Therapy: WFL for tasks assessed/performed Overall Cognitive Status: Within Functional Limits for tasks assessed                             Pertinent Vitals/Pain Pain Assessment: 0-10 Pain Score: 7  Pain Location: neck, throat Pain Descriptors / Indicators: Aching;Burning Pain Intervention(s): Limited activity within patient's tolerance;Monitored during session;Repositioned           PT Goals (current goals can now be found in the care plan section) Acute Rehab PT Goals Patient Stated Goal: to go home and care for herself as her husband is really not able to care for her Progress towards PT goals: Progressing toward goals    Frequency  Min 5X/week    PT Plan Current plan remains appropriate       End of Session Equipment Utilized During Treatment: Gait belt Activity Tolerance: Patient limited by fatigue;Patient limited by pain Patient left: in chair;with call bell/phone within reach;with nursing/sitter in room     Time: 1633-1710 PT Time Calculation (min): 37 min  Charges:  $Gait Training: 8-22 mins $Therapeutic Activity: 8-22 mins  Barbarann Ehlers Brighid Koch, PT, DPT [phone]   12/15/2013, 5:15 PM

## 2013-12-15 NOTE — Progress Notes (Signed)
Pt assessment unchanged.  Pt chief complaint at this time is nausea and vomiting.  Pt given new emesis bag at the beginning of the shift this am (0700).  Since this morning the patient has vomited approximately 20cc of emesis. Emesis has been saved in a lab sample bottle in the pt's room.  Pt has received Zofran and Phenergan throughout the day to decrease her nausea.  Please refer to [facility] for specific times.  This information will be passed on to the next nurse in shift report and the night shift RN will continue to monitor for any more episodes of vomiting.  Johnson,Alexy Bringle A 12/15/2013

## 2013-12-15 NOTE — Consult Note (Signed)
Referring Provider: Dr. Kristeen Miss Primary Care Physician: unassigned  (PCP is at community health center in [place], [place])  Primary Gastroenterologist:  [place] Josephina Gip, [place])  Reason for Consultation:  Nausea and vomiting  HPI: Carmen Deleon is a 58 y.o. female  who is several days status post an uncomplicated cervical discectomy but in the PACU following that operation, she `received some Dilaudid, which she is ostensibly allergic to.   Back in April, when she received Dilaudid, as well as numerous other medications following some surgery, she had the new onset of nausea and vomiting which lasted for several months.   Prior to that, the patient had a history of reflux symptoms, as well as dysphagia and IBS of the "alternating" variety, but had not been bothered by nausea and vomiting.   Between April and July of this year, she would develop frank vomiting within 30 minutes after every meal, bringing up either undigested food or typical brown emesis material. She would not vomit if she did not eat, therefore, she sometimes avoided meals. It did not seem to matter what she ate, or how much she ate. Despite all this, however, she did not have any weight loss.   She had improved to the point where she would only have some minor heaving, without full-fledged vomiting, over the past several months until she received the Dilaudid in the PACU the other day. Ever since then, she has been back to the vomiting problem. It is not clear to me, however, that she has had emesis actually observed --  no emesis is recorded for the past several days on her intake and output record.  The patient has been followed for her nausea and vomiting problem by the physician assistant in the functional disease clinic at the GI division at [facility], Josephina Gip.she was treated with Remeron, as well as Zofran and Phenergan. Of note, the patient's home medications include Amitiza, which is famous for  causing nausea. The patient states that earlier this year, she had a normal endoscopy and colonoscopy at [facility]    Past Medical History  Diagnosis Date  . Depression   . Hyperlipidemia   . Neuromuscular disorder   . AC (acromioclavicular) joint bone spurs   . Pinched nerve in neck   . PONV (postoperative nausea and vomiting)   . Difficult intubation 2006    with first endoscopy; did ok with endoscopy 10/15  . Dysphagia     cannot swallow large pills, bread etc  . GERD (gastroesophageal reflux disease)   . Gastroparesis   . Palpitations   . Hx-TIA (transient ischemic attack) 2010  . Shortness of breath     exertion  . Anxiety   . Headache   . Migraine   . Fibromyalgia   . Arthritis   . Heart murmur     echo 04/26/13: NL LVF, EF 84%, NL wall motion, mild PR, mild-mod TR, mild pulm HTN, mod MR, mild-mod AR (Dr. Neoma Laming)    Past Surgical History  Procedure Laterality Date  . Abdominal hysterectomy    . Colonoscopy with esophagogastroduodenoscopy (egd)    . Cardiovascular stress test      05/03/13 ([facility]): probable NL, but can't r/o mild apical ischemia, EF 82%-->Cardiac CTA 05/20/13: Calcium score 8.9, right dominant system, normal coronaries  . Anterior cervical decomp/discectomy fusion N/A 12/12/2013    Procedure: ANTERIOR CERVICAL DECOMPRESSION/DISCECTOMY FUSION 2 LEVELS;  Surgeon: Kristeen Miss, MD;  Location: [facility]UR[facility] ORS;  Service: Neurosurgery;  Laterality: N/A;  C5-6 C6-7 Anterior cervical decompression/diskectomy/fusion    Prior to Admission medications   Medication Sig Start Date End Date Taking? Authorizing Provider  acyclovir (ZOVIRAX) 400 MG tablet Take 400 mg by mouth daily.  07/14/11  Yes Historical Provider, MD  aspirin EC 81 MG tablet Take 81 mg by mouth daily.   Yes Historical Provider, MD  baclofen (LIORESAL) 10 MG tablet Take 10 mg by mouth 3 (three) times daily.   Yes Historical Provider, MD  Cholecalciferol (VITAMIN D3) 400 UNITS CHEW  Chew 400 Units by mouth 2 (two) times daily.   Yes Historical Provider, MD  diazepam (VALIUM) 2 MG tablet Take 2 mg by mouth 4 (four) times daily as needed for muscle spasms.  07/14/11  Yes Historical Provider, MD  diclofenac sodium (VOLTAREN) 1 % GEL Apply 2 g topically 4 (four) times daily. 04/22/12  Yes Terance Ice, MD  esterified estrogens (MENEST) 0.625 MG tablet Take 0.625 mg by mouth at bedtime.   Yes Historical Provider, MD  lubiprostone (AMITIZA) 24 MCG capsule Take 24 mcg by mouth 2 (two) times daily with a meal.   Yes Historical Provider, MD  LYRICA 50 MG capsule Take 50 mg by mouth 3 (three) times daily.  07/14/11  Yes Historical Provider, MD  mirtazapine (REMERON) 15 MG tablet Take 15 mg by mouth at bedtime.   Yes Historical Provider, MD  Multiple Minerals-Vitamins ([employer] CALCIUM 600 PLUS) CHEW Chew 600 mg by mouth 2 (two) times daily.   Yes Historical Provider, MD  omeprazole (PRILOSEC) 40 MG capsule Take 40 mg by mouth 2 (two) times daily.  07/15/11  Yes Historical Provider, MD  ondansetron (ZOFRAN) 4 MG tablet Take 4 mg by mouth every 8 (eight) hours as needed for nausea or vomiting.   Yes Historical Provider, MD  Polyethylene Glycol 3350 (MIRALAX PO) Take 1 application by mouth daily as needed (for constipation). Takes 1 heaping teaspoon   Yes Historical Provider, MD    Current Facility-Administered Medications  Medication Dose Route Frequency Provider Last Rate Last Dose  . 0.9 %  sodium chloride infusion  250 mL Intravenous Continuous Kristeen Miss, MD 50 mL/hr at 12/14/13 0225 250 mL at 12/14/13 0225  . acetaminophen (TYLENOL) tablet 650 mg  650 mg Oral Q4H PRN Kristeen Miss, MD       Or  . acetaminophen (TYLENOL) suppository 650 mg  650 mg Rectal Q4H PRN Kristeen Miss, MD   650 mg at 12/13/13 1413  . acyclovir (ZOVIRAX) tablet 400 mg  400 mg Oral Daily Kristeen Miss, MD   400 mg at 12/15/13 1048  . alum & mag hydroxide-simeth (MAALOX/MYLANTA) 200-200-20 MG/5ML suspension 30 mL   30 mL Oral Q6H PRN Kristeen Miss, MD      . baclofen (LIORESAL) tablet 10 mg  10 mg Oral TID Kristeen Miss, MD   10 mg at 12/15/13 1708  . bisacodyl (DULCOLAX) suppository 10 mg  10 mg Rectal Daily PRN Kristeen Miss, MD      . diazepam (VALIUM) tablet 5 mg  5 mg Oral Q6H PRN Kristeen Miss, MD   5 mg at 12/14/13 1354  . docusate sodium (COLACE) capsule 100 mg  100 mg Oral BID Kristeen Miss, MD   100 mg at 12/15/13 2130  . esterified estrogens (MENEST) tablet 0.625 mg  0.625 mg Oral QHS Kristeen Miss, MD      . lubiprostone (AMITIZA) capsule 24 mcg  24 mcg Oral BID WC Kristeen Miss, MD  24 mcg at 12/15/13 1708  . menthol-cetylpyridinium (CEPACOL) lozenge 3 mg  1 lozenge Oral PRN Kristeen Miss, MD   3 mg at 12/13/13 9937   Or  . phenol (CHLORASEPTIC) mouth spray 1 spray  1 spray Mouth/Throat PRN Kristeen Miss, MD      . mirtazapine (REMERON) tablet 15 mg  15 mg Oral QHS Kristeen Miss, MD   15 mg at 12/14/13 2100  . morphine 2 MG/ML injection 1-4 mg  1-4 mg Intravenous Q3H PRN Kristeen Miss, MD   4 mg at 12/15/13 1315  . ondansetron (ZOFRAN) injection 4 mg  4 mg Intravenous Q4H PRN Kristeen Miss, MD   4 mg at 12/15/13 1696  . ondansetron (ZOFRAN) tablet 4 mg  4 mg Oral Q8H PRN Kristeen Miss, MD      . oxyCODONE-acetaminophen (PERCOCET/ROXICET) 5-325 MG per tablet 1-2 tablet  1-2 tablet Oral Q4H PRN Kristeen Miss, MD      . pantoprazole (PROTONIX) EC tablet 40 mg  40 mg Oral Daily Kristeen Miss, MD   40 mg at 12/15/13 7893  . polyethylene glycol (MIRALAX / GLYCOLAX) packet 17 g  17 g Oral Daily PRN Kristeen Miss, MD      . pregabalin (LYRICA) capsule 50 mg  50 mg Oral TID Kristeen Miss, MD   50 mg at 12/15/13 1709  . promethazine (PHENERGAN) injection 25 mg  25 mg Intramuscular Q4H PRN Kristeen Miss, MD   25 mg at 12/15/13 1314  . senna (SENOKOT) tablet 8.6 mg  1 tablet Oral BID Kristeen Miss, MD   8.6 mg at 12/15/13 0939  . sodium chloride 0.9 % injection 3 mL  3 mL Intravenous Q12H Kristeen Miss, MD   3 mL at 12/14/13  0943  . sodium chloride 0.9 % injection 3 mL  3 mL Intravenous PRN Kristeen Miss, MD        Allergies as of 11/25/2013 - Review Complete 07/07/2012  Allergen Reaction Noted  . Codeine  09/26/2011  . Tramadol  09/26/2011    Family History  Problem Relation Age of Onset  . Stroke Mother   . Heart disease Mother   . Cancer Mother   . Hypertension Mother   . Arthritis Mother   . Depression Mother   . Cancer Father   . Arthritis Father   . Depression Father     History   Social History  . Marital Status: Married    Spouse Name: N/A    Number of Children: N/A  . Years of Education: N/A   Occupational History  . Not on file.   Social History Main Topics  . Smoking status: Former Research scientist (life sciences)  . Smokeless tobacco: Never Used  . Alcohol Use: No  . Drug Use: No  . Sexual Activity: Not on file   Other Topics Concern  . Not on file   Social History Narrative  . No narrative on file    Review of Systems:  dysphagia symptoms, but no weight loss. Reflux symptoms, on omeprazole prior to admission.  Alternates between constipation and diarrhea, using Amitiza and MiraLAX to help maintain defecation   Physical Exam: Vital signs in last 24 hours: Temp:  [97.9 F (36.6 C)-98.9 F (37.2 C)] 97.9 F (36.6 C) (10/15 1743) Pulse Rate:  [84-108] 108 (10/15 1743) Resp:  [18-20] 18 (10/15 1743) BP: (104-129)/(62-86) 104/86 mmHg (10/15 1743) SpO2:  [95 %-99 %] 98 % (10/15 1743) Last BM Date: 12/11/13 General:   Alert,  Well-developed, well-nourished, pleasant and  cooperative in NAD. No evidence of malnutrition grossly. Head:  Normocephalic and atraumatic. Eyes:  Sclera clear, no icterus.   Conjunctiva pink. Neck:   No masses or thyromegaly. left-sided incision corresponding to recent discectomy surgery. Lungs:  Clear throughout to auscultation.   No wheezes, crackles, or rhonchi. No evident respiratory distress. Heart:   Regular rate and rhythm; no murmurs, clicks, rubs,  or  gallops. Abdomen:  Soft, nontender, somewhat protuberant but not frankly distended. No masses or ventral hernias noted. No guarding or rebound.   Msk:   Symmetrical without gross deformities. Extremities:   Without clubbing, cyanosis, or edema. Neurologic:  Alert and coherent;  grossly normal neurologically. Skin:  Intact without significant lesions or rashes. Cervical Nodes:  No significant cervical adenopathy. Psych:   Alert and cooperative, but comes across as being somewhat anxious and depressed.   Intake/Output from previous day: 10/14 0701 - 10/15 0700 In: 243 [P.O.:240; I.Deleon.:3] Out: -  Intake/Output this shift: Total I/O In: 480 [P.O.:480] Out: -   Lab Results:  Recent Labs  12/15/13 1030  WBC 7.5  HGB 13.0  HCT 39.0  PLT 204   BMET  Recent Labs  12/15/13 1030  NA 140  K 3.9  CL 102  CO2 27  GLUCOSE 86  BUN 7  CREATININE 0.73  CALCIUM 9.0   LFT No results found for this basename: PROT, ALBUMIN, AST, ALT, ALKPHOS, BILITOT, BILIDIR, IBILI,  in the last 72 hours PT/INR No results found for this basename: LABPROT, INR,  in the last 72 hours  Studies/Results: No results found.  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 Based on previous evaluation and the patient's overall picture, I doubt that a specific organic lesion is present.   Plan: The patient and I discussed options of diagnostic evaluation, such as a gastric emptying scan, versus a therapeutic attempt with a gastroparesis diet. I have recommended the latter, and she is agreeable. We will start with frequent small aliquots of liquids and see if that is tolerated. Furthermore, I will try to get objective confirmation of any emesis that occurs.   LOS: 3 days   Carmen Deleon  12/15/2013, 6:25 PM  And

## 2013-12-16 NOTE — Progress Notes (Signed)
There seems to be a discrepancy between the patient's subjective nausea and her stated severity of vomiting, and what is actually being on objectively observed.  When I came in the room, she picked up the emesis bag, which had been lying on her bed and was empty, and immediately began retching and spitting. However, there was no emesis. She apologized for the retching.  She indicates that she has been keeping up with the frequent small aliquots liquid intake as requested.  Review of the nurse's notes indicates findings similar to what Dr. Ellene Route and I have observed, namely, saliva and spit, but no true emesis.  It is clear that, if she is drinking the fluid as requested, her intake is exceeding her emesis output by a great deal.  Recommendation: Continue current management overnight. Tomorrow, consider possibly advancing to larger, less frequent boluses of fluid intake (for example, 60 mL once an hour).  Carmen Deleon, M.D. [phone]

## 2013-12-16 NOTE — Progress Notes (Signed)
UR complete.  Egidio Lofgren RN, MSN 

## 2013-12-16 NOTE — Progress Notes (Signed)
Patient ID: Carmen Deleon, female   DOB: 1955-03-13, 58 y.o.   MRN: [id number] Patient still complains of nausea. She notes that she's been holding some minimal liquids down but has had several episodes of spitting up. She shows some minimal phlegm-like material in the bottom of a small medicine cup. An emesis bag at her side is empty.  She still states she feels poorly.  From the point of view of her neck she has some burning across the back of her shoulders but her arm strength in hand strength has remained stable. Her ability to swallow has also been intact.  I appreciate the consultation and help from Dr. Cristina Gong. For the time being would like to continue a diet with very small portions. If things don't improve some testing may be in order.  L. be out of town for the weekend in my absence Dr. Trenton Gammon and Kathyrn Sheriff will be checking on her over the weekend.

## 2013-12-16 NOTE — Progress Notes (Signed)
Physical Therapy Treatment Patient Details Name: Carmen Deleon MRN: [id number] DOB: 1955-08-01 Today's Date: 12/16/2013    History of Present Illness 58 y.o. female admitted to [facility] on 12/12/13 for elective C5/6, C6/7 ACDF.  Pt with significant PMHx of depression/anxiety, AC joint spurs, dysphasia (per chart cannot swallow large pills and bread),  TIA, and fibromyalgia.     PT Comments    Pt is progressing well with her therapy despite pain, nausea, and weakness.  We were able to start stair training and education today and I reinforced neck precautions.  PT will continue to follow acutely.    Follow Up Recommendations  Home health PT     Equipment Recommendations  Rolling walker with 5" wheels    Recommendations for Other Services   NA     Precautions / Restrictions Precautions Precautions: Cervical;Fall Precaution Comments: verbally reviewed precautions, lifting restrictions, and log roll for into/out of bed.     Mobility  Bed Mobility Overal bed mobility: Needs Assistance Bed Mobility: Rolling;Sidelying to Sit;Sit to Sidelying Rolling: Min assist Sidelying to sit: Min assist     Sit to sidelying: Min assist General bed mobility comments: Min assist to help support trunk during transitions.  Verbal cues for knee flexion and for reverse log roll when getting back into bed.   Transfers Overall transfer level: Needs assistance Equipment used: Rolling walker (2 wheeled) Transfers: Sit to/from Stand Sit to Stand: Min guard         General transfer comment: Min guard assist for safety due to slow transitions over flexed knees.   Ambulation/Gait Ambulation/Gait assistance: Min guard Ambulation Distance (Feet): 100 Feet Assistive device: Rolling walker (2 wheeled) Gait Pattern/deviations: Step-through pattern;Shuffle;Trunk flexed Gait velocity: decreased Gait velocity interpretation: <1.8 ft/sec, indicative of risk for recurrent falls General Gait Details: Pt with  very slow gait speed, flexed forward head posture.  Pt spit into her emesis bag, but did not seem to vomit during our session despite nausea.     Stairs Stairs: Yes Stairs assistance: Min guard Stair Management: Two rails;Step to pattern;Forwards Number of Stairs: 5 General stair comments: Verbal cues for safety, step-to pattern, and to lead up with her strong (r) leg and down with her weakner/painful (L) leg.  Min guard assist for safety, especially when descending the stairs.       Balance Overall balance assessment: Needs assistance Sitting-balance support: Feet supported Sitting balance-Leahy Scale: Good     Standing balance support: Bilateral upper extremity supported Standing balance-Leahy Scale: Fair                      Cognition Arousal/Alertness: Lethargic Behavior During Therapy: WFL for tasks assessed/performed Overall Cognitive Status: Within Functional Limits for tasks assessed                         General Comments General comments (skin integrity, edema, etc.): Pt did not feel like sitting up after gait today.  She reports too fatigued and painful .      Pertinent Vitals/Pain Pain Assessment: 0-10 Pain Score: 8  Pain Location: incisional Pain Descriptors / Indicators: Aching;Burning Pain Intervention(s): Limited activity within patient's tolerance;Monitored during session;Repositioned     PT Goals (current goals can now be found in the care plan section) Acute Rehab PT Goals Patient Stated Goal: to go home and care for herself as her husband is really not able to care for her Progress towards PT goals: Progressing toward goals  Frequency  Min 5X/week    PT Plan Current plan remains appropriate       End of Session Equipment Utilized During Treatment: Gait belt Activity Tolerance: Patient limited by fatigue;Patient limited by pain Patient left: in bed;with call bell/phone within reach     Time: 4827-0786 PT Time  Calculation (min): 21 min  Charges:  $Gait Training: 8-22 mins                      Lenox Ladouceur B. Karla Pavone, PT, DPT [phone]   12/16/2013, 5:19 PM

## 2013-12-17 NOTE — Progress Notes (Signed)
Physical Therapy Treatment Patient Details Name: Carmen Deleon MRN: [id number] DOB: [birth year] Today's Date: 12/17/2013    History of Present Illness 58 y.o. female admitted to [facility] on 12/12/13 for elective C5/6, C6/7 ACDF.  Pt with significant PMHx of depression/anxiety, AC joint spurs, dysphasia (per chart cannot swallow large pills and bread),  TIA, and fibromyalgia.     PT Comments    Patient reports pain and nausea, but agreeable to trying therapy.  MIN assist for bed mobility and transfers, with good compliance with spine precautions.  MIN guard for ambulation, slow pace.  Instruction in shoulder exercises for pain mitigation without significant change today.  Patient kept emesis bag nearby during treatment.  Patient remains appropriate for skilled PT services at this time.  Follow Up Recommendations  Home health PT     Equipment Recommendations  Rolling walker with 5" wheels    Recommendations for Other Services       Precautions / Restrictions Precautions Precautions: Cervical;Fall Precaution Comments: verbally reviewed precautions, lifting restrictions, and log roll for into/out of bed.  Restrictions Weight Bearing Restrictions: No    Mobility  Bed Mobility Overal bed mobility: Needs Assistance Bed Mobility: Rolling;Sidelying to Sit;Sit to Sidelying Rolling: Min assist Sidelying to sit: Min assist     Sit to sidelying: Min assist General bed mobility comments: Min assist to help support trunk during transitions.    Transfers Overall transfer level: Needs assistance Equipment used: Rolling walker (2 wheeled) Transfers: Sit to/from Stand Sit to Stand: Min guard            Ambulation/Gait Ambulation/Gait assistance: Min guard Ambulation Distance (Feet): 200 Feet Assistive device: Rolling walker (2 wheeled) Gait Pattern/deviations: Step-through pattern;Decreased stride length Gait velocity: decreased Gait velocity interpretation: Below normal speed for  age/gender General Gait Details: Pt with very slow gait speed, flexed forward head posture.  Pt spit into her emesis bag, but did not seem to vomit during our session despite nausea.     Stairs            Wheelchair Mobility    Modified Rankin (Stroke Patients Only)       Balance Overall balance assessment: Needs assistance   Sitting balance-Leahy Scale: Good       Standing balance-Leahy Scale: Fair                      Cognition Arousal/Alertness: Awake/alert Behavior During Therapy: WFL for tasks assessed/performed Overall Cognitive Status: Within Functional Limits for tasks assessed                      Exercises Other Exercises Other Exercises: Shoulder elevation for pain relief x 10 reps (Pain free range, as tolerated.) Other Exercises: Shoulder protraction/retraction for pain x 10 reps (Pain free range, as tolerated.)    General Comments        Pertinent Vitals/Pain Pain Assessment: 0-10 Pain Score: 9  Pain Location: Neck / incision Pain Descriptors / Indicators: Aching;Burning Pain Intervention(s): Limited activity within patient's tolerance;Monitored during session;Premedicated before session;Relaxation    Home Living Family/patient expects to be discharged to:: Private residence Living Arrangements: Spouse/significant other Available Help at Discharge: Family;Other (Comment) (spouse not in good health) Type of Home: House Home Access: Stairs to enter Entrance Stairs-Rails: Left Home Layout: One level Home Equipment: None (of her own)      Prior Function Level of Independence: Needs assistance  Gait / Transfers Assistance Needed: pt is independent with gait ADL's /  Homemaking Assistance Needed: assistance with LB dressing.     PT Goals (current goals can now be found in the care plan section) Acute Rehab PT Goals Patient Stated Goal: to go home and care for herself as her husband is really not able to care for her PT Goal  Formulation: With patient Time For Goal Achievement: 12/21/13 Potential to Achieve Goals: Good Progress towards PT goals: Progressing toward goals    Frequency  Min 5X/week    PT Plan Current plan remains appropriate    Co-evaluation             End of Session Equipment Utilized During Treatment: Gait belt Activity Tolerance: Patient limited by pain;Patient tolerated treatment well Patient left: in bed;with call bell/phone within reach     Time: 0383-3383 PT Time Calculation (min): 31 min  Charges:  $Gait Training: 8-22 mins $Therapeutic Activity: 8-22 mins                    G Codes:      Yoceline Bazar L 01/05/2014, 5:14 PM

## 2013-12-17 NOTE — Progress Notes (Signed)
Somewhat improved w/ respect to nausea.  "I keep some down, and bring some back up."  As before, shades are drawn, room is dark, and after I arrive, she picks up her emesis bag and retches and spits.  No notation of documented emesis from nurses in progress notes; no emesis recorded in doc flowsheet either.  Impr:  Functional nausea and vomiting -- doubt an organic disturbance is present  Plan:  Increase bolus size of clear liquids; if tolerated overnight, begin low-residue diet tomorrow.  Cleotis Nipper, M.D. [phone]

## 2013-12-17 NOTE — Progress Notes (Signed)
No issues overnight. Pt does continue to c/o nausea, perhaps slightly improved today. Is requesting increase to a soft diet. Does c/o appropriate neck soreness.  EXAM:  BP 99/59  Pulse 80  Temp(Src) 98.4 F (36.9 C) (Oral)  Resp 16  Ht 5\' 3"  (1.6 m)  Wt 66.1 kg (145 lb 11.6 oz)  BMI 25.82 kg/m2  SpO2 98%  Awake, alert, oriented  Speech fluent, appropriate  CN grossly intact  5/5 BUE/BLE   IMPRESSION:  58 y.o. female POD# 5 s/p ACDF with postop nausea. Neurologically at baseline  PLAN: - increase diet per GI recs to 60cc fluid / hr

## 2013-12-18 NOTE — Progress Notes (Signed)
Looks and feels somewhat better, and ready to start solid food.  For the first time, she did not pick up her emesis bag and start retching when I came in today.  Have ordered low residue diet.  ???? Home tomorrow if she tolerates solid food???  Cleotis Nipper, M.D. [phone]

## 2013-12-18 NOTE — Progress Notes (Signed)
Patient still having intermittent nausea and vomiting although she states she's a little better today. No new neck pain or complaints. Upper trimming symptoms continue to do well.  Awake and alert. Oriented and appropriate. Motor and sensory function intact. Chest and abdomen benign.  Postoperative recovery from anterior cervical decompression infusion complicated by extreme postoperative nausea vomiting. The symptoms improved being. Doing with time and medication. Continue current regimen. Continue efforts at mobilization.

## 2013-12-19 MED ORDER — CYCLOBENZAPRINE HCL 10 MG PO TABS: 10.0000 mg | ORAL_TABLET | Freq: Three times a day (TID) | ORAL | Status: DC | PRN

## 2013-12-19 MED ORDER — ONDANSETRON HCL 4 MG PO TABS
4.0000 mg | ORAL_TABLET | Freq: Three times a day (TID) | ORAL | Status: DC | PRN
  Administered 2013-12-19: 4 mg via ORAL
  Filled 2013-12-19: qty 1

## 2013-12-19 MED ORDER — PROMETHAZINE HCL 25 MG PO TABS: 25.0000 mg | ORAL_TABLET | ORAL | Status: DC | PRN

## 2013-12-19 MED ORDER — CYCLOBENZAPRINE HCL 10 MG PO TABS
10.0000 mg | ORAL_TABLET | Freq: Three times a day (TID) | ORAL | Status: DC | PRN
Start: 2013-12-19 — End: 2013-12-20
  Administered 2013-12-19 – 2013-12-20 (×3): 10 mg via ORAL
  Filled 2013-12-19 (×4): qty 1

## 2013-12-19 NOTE — Progress Notes (Signed)
Subjective: Patient reports nausea  Objective: Vital signs in last 24 hours: Temp:  [98.1 F (36.7 C)-98.8 F (37.1 C)] 98.2 F (36.8 C) (10/19 1014) Pulse Rate:  [81-88] 84 (10/19 1014) Resp:  [16-18] 18 (10/19 1014) BP: (94-114)/(40-75) 110/75 mmHg (10/19 1014) SpO2:  [93 %-98 %] 96 % (10/19 1014)  Intake/Output from previous day: 10/18 0701 - 10/19 0700 In: 120 [P.O.:120] Out: -  Intake/Output this shift: Total I/O In: 120 [P.O.:120] Out: -   no objective signs of disease but patient still asking for and receiving parenteral meds.  Lab Results: No results found for this basename: WBC, HGB, HCT, PLT,  in the last 72 hours BMET No results found for this basename: NA, K, CL, CO2, GLUCOSE, BUN, CREATININE, CALCIUM,  in the last 72 hours  Studies/Results: No results found.  Assessment/Plan: Stop all iv meds. Change to po's . Patient states she doesn't have a ride today but will have a way home tomorrow.  Strong sense that patient is manipulating her situation...   LOS: 7 days  [place] J 12/19/2013, 11:40 AM

## 2013-12-19 NOTE — Progress Notes (Signed)
The patient seems to be tolerating her solid diet reasonably well. She says she's "bringing up a little bit" which is typical for her. She is sitting up in a chair, out of bed, looking stronger and happier than previously.  Impression: Some residual lingering nausea and gastric dysmotility symptoms, but overall trend toward improvement, in a patient with a chronic tendency in this direction  Recommendation: I think the patient is okay to go home from the GI tract standpoint. I discussed this with her, and she did not seem to object, although she did mention that she is still having neck pain, she acknowledges that she is still having some dyspeptic symptoms, etc.   I think she has reached the point of maximal hospital benefit from the GI tract standpoint; she will probably continue to have some degree of GI symptomatology for some time to come.  She has been followed at the [facility] clinic at [facility]) and I have encouraged her to return there for further evaluation if her symptoms persist.    In response to the patient's inquiry, I do not feel she needs further GI testing at this time, beyond what has already been done at [facility].  However, they may elect to do further testing in the future, depending how her case evolves over time.  Cleotis Nipper, M.D. [phone]

## 2013-12-19 NOTE — Progress Notes (Signed)
Occupational Therapy Treatment Patient Details Name: Teria Khachatryan MRN: [id number] DOB: [birth year] Today's Date: 12/19/2013    History of present illness 58 y.o. female admitted to [facility] on 12/12/13 for elective C5/6, C6/7 ACDF.  Pt with significant PMHx of depression/anxiety, AC joint spurs, dysphasia (per chart cannot swallow large pills and bread),  TIA, and fibromyalgia.    OT comments  Pt. Able to complete hair washing today with min a and cues for maintaining cervical precautions.  Able to demonstrate LB don/doff of socks and reports she has A/E to assist with LB ADLS as needed.  Continues to c/o nausea but completed session with no dry heaving or vomiting. Requested coffee at end of session stating "i want to give it a try".   Moves slow with all transfers and ambulation, but progressing with acute OT goals.  Will benefit from continued therapies for increasing strength and safety with adls.  Follow Up Recommendations  Home health OT;Supervision/Assistance - 24 hour    Equipment Recommendations  3 in 1 bedside comode;Tub/shower bench;Other (comment)          Precautions / Restrictions Precautions Precautions: Cervical;Fall Precaution Comments: verbally reviewed precautions, lifting restrictions, and log roll for into/out of bed.        Mobility Bed Mobility               General bed mobility comments: pt. sitting eob upon arrival  Transfers Overall transfer level: Needs assistance Equipment used: Rolling walker (2 wheeled) Transfers: Sit to/from Stand Sit to Stand: Min guard         General transfer comment: Min guard assist for safety due to slow transitions                                       ADL Overall ADL's : Needs assistance/impaired     Grooming: Brushing hair;Minimal assistance;Sitting (washed hair with shampoo shower cap) Grooming Details (indicate cue type and reason): required min a secondary to difficulty maintaining cervical  precautions              Lower Body Dressing: Min guard;Sitting/lateral leans Lower Body Dressing Details (indicate cue type and reason):  pt. able to cross L/R leg over to don/doff socks, LLE with greater difficulty than R.  has A/E to assist with LB ADLS as needed Toilet Transfer: Min guard;Ambulation;RW Toilet Transfer Details (indicate cue type and reason): cues for maintaining cervical precuations, relies heavily on B UES during sit/stand           General ADL Comments: reviewed theraputty for hand strengthening, pt. able to return demo from previous day, encouraged daily use.  completed hair washing, and LB dressing tasks.                                        Cognition   Behavior During Therapy: Flat affect Overall Cognitive Status: Within Functional Limits for tasks assessed                                                    Pertinent Vitals/ Pain       Pain Score:  (did not rate but nausea comlaints  throughout session)                                                          Frequency Min 2X/week     Progress Toward Goals  OT Goals(current goals can now be found in the care plan section)  Progress towards OT goals: Progressing toward goals     Plan Discharge plan remains appropriate                     End of Session Equipment Utilized During Treatment: Gait belt;Rolling walker   Activity Tolerance Patient tolerated treatment well   Patient Left in chair;with call bell/phone within reach             Time: 0822-0848 OT Time Calculation (min): 26 min  Charges: OT General Charges $OT Visit: 1 Procedure OT Treatments $Self Care/Home Management : 23-37 mins  Janice Coffin , COTA/L  12/19/2013, 9:12 AM

## 2013-12-19 NOTE — Discharge Summary (Signed)
Physician Discharge Summary  Patient ID: Carmen Deleon MRN: [id number] DOB/AGE: 11/07/1955 58 y.o.  Admit date: 12/12/2013 Discharge date: 12/19/2013  Admission Diagnoses: Cervical spondylosis with radiculopathy and myelopathy C5-6 C6-C7, syringomyelia  Discharge Diagnoses: Cervical spondylosis with radiculopathy and myelopathy C5-6 C6-7, syringomyelia. Postoperative nausea and vomiting. Gastroparesis Active Problems:   Cervical spondylosis with myelopathy and radiculopathy   Postoperative nausea and vomiting   Discharged Condition: fair  Hospital Course: Patient was admitted to undergo a 2 level anterior decompression arthrodesis C5-6 and C6-C7. Postoperatively the patient complained bitterly of nausea and vomiting. She felt this was related to a reaction to Dolobid which she received in the recovery room. The patient states she is allergic to this medication and had nausea and vomiting for 4 months after previous procedure. Patient had supportive care but was continuing to have difficulties and on the fourth postoperative day I requested gastro-enterology to evaluate the patient. Dr. Orvan July had suggested starting feedings and small amounts and over the next 3 days the patient was able to tolerate some food. She was however requesting a considerable amount of parenteral narcotic analgesic which has since been discontinued. At the current time she is discharged home with Flexeril for pain control.  Consults: GI  Significant Diagnostic Studies: None  Treatments: surgery: Anterior cervical decompression C5-6 C6-C7 arthrodesis with structural allograft anterior plate fixation.  Discharge Exam: Blood pressure 108/68, pulse 100, temperature 98.1 F (36.7 C), temperature source Oral, resp. rate 18, height 5\' 3"  (1.6 m), weight 66.1 kg (145 lb 11.6 oz), SpO2 97.00%. Incision is clean and dry. Motor function appears intact in the upper extremities and lower extremities. Bowel sounds are  active. Breath sounds are clear.  Disposition: Discharge home  Discharge Instructions   Call MD for:  redness, tenderness, or signs of infection (pain, swelling, redness, odor or green/yellow discharge around incision site)    Complete by:  As directed      Call MD for:  severe uncontrolled pain    Complete by:  As directed      Call MD for:  temperature >100.4    Complete by:  As directed      Diet - low sodium heart healthy    Complete by:  As directed      Discharge instructions    Complete by:  As directed   Okay to shower. Do not apply salves or appointments to incision. No heavy lifting with the upper extremities greater than 15 pounds. May resume driving when not requiring pain medication and patient feels comfortable with doing so.     Increase activity slowly    Complete by:  As directed             Medication List    STOP taking these medications       baclofen 10 MG tablet  Commonly known as:  LIORESAL      TAKE these medications       acyclovir 400 MG tablet  Commonly known as:  ZOVIRAX  Take 400 mg by mouth daily.     aspirin EC 81 MG tablet  Take 81 mg by mouth daily.     [employer] CALCIUM 600 PLUS Chew  Chew 600 mg by mouth 2 (two) times daily.     cyclobenzaprine 10 MG tablet  Commonly known as:  FLEXERIL  Take 1 tablet (10 mg total) by mouth 3 (three) times daily as needed for muscle spasms.     diazepam 2 MG tablet  Commonly known  as:  VALIUM  Take 2 mg by mouth 4 (four) times daily as needed for muscle spasms.     diclofenac sodium 1 % Gel  Commonly known as:  VOLTAREN  Apply 2 g topically 4 (four) times daily.     esterified estrogens 0.625 MG tablet  Commonly known as:  MENEST  Take 0.625 mg by mouth at bedtime.     lubiprostone 24 MCG capsule  Commonly known as:  AMITIZA  Take 24 mcg by mouth 2 (two) times daily with a meal.     LYRICA 50 MG capsule  Generic drug:  pregabalin  Take 50 mg by mouth 3 (three) times daily.     MIRALAX PO   Take 1 application by mouth daily as needed (for constipation). Takes 1 heaping teaspoon     mirtazapine 15 MG tablet  Commonly known as:  REMERON  Take 15 mg by mouth at bedtime.     omeprazole 40 MG capsule  Commonly known as:  PRILOSEC  Take 40 mg by mouth 2 (two) times daily.     ondansetron 4 MG tablet  Commonly known as:  ZOFRAN  Take 4 mg by mouth every 8 (eight) hours as needed for nausea or vomiting.     Vitamin D3 400 UNITS Chew  Chew 400 Units by mouth 2 (two) times daily.         SignedEarleen Newport 12/19/2013, 9:49 PM

## 2013-12-19 NOTE — Progress Notes (Addendum)
Physical Therapy Treatment Patient Details Name: Carmen Deleon MRN: [id number] DOB: [birth year] Today's Date: 12/19/2013    History of Present Illness 58 y.o. female admitted to [facility] on 12/12/13 for elective C5/6, C6/7 ACDF.  Pt with significant PMHx of depression/anxiety, AC joint spurs, dysphasia (per chart cannot swallow large pills and bread),  TIA, and fibromyalgia.     PT Comments    Pt is progressing well with her mobility.  She needed a little more assist simulating home entry than when we last tried stairs with bil rails.  We will continue to practice this so that she can get to supervision level.  Pt continues to report significant pain and nausea.  PT continues to recommend HHPT at discharge.  PT to follow acutely.   Follow Up Recommendations  Home health PT     Equipment Recommendations  Rolling walker with 5" wheels    Recommendations for Other Services   NA     Precautions / Restrictions Precautions Precautions: Cervical;Fall Precaution Comments: verbally reviewed precautions, lifting restrictions, and log roll for into/out of bed.     Mobility  Bed Mobility             Sit to sidelying: Supervision General bed mobility comments: Supervision for safety.   Transfers Overall transfer level: Needs assistance Equipment used: Rolling walker (2 wheeled) Transfers: Sit to/from Stand Sit to Stand: Min guard         General transfer comment: Min guard assist for safety due to slow transitions and heavy reliance on hands for support.   Ambulation/Gait Ambulation/Gait assistance: Min guard Ambulation Distance (Feet): 100 Feet Assistive device: Rolling walker (2 wheeled) Gait Pattern/deviations: Step-through pattern;Shuffle;Trunk flexed Gait velocity: 0.02 ft/second Gait velocity interpretation: <1.8 ft/sec, indicative of risk for recurrent falls General Gait Details: Pt encouraged to have more upright posture during gait as she is reporting her posterior  neck and shoulder muscles are hurting.  She states this makes them even more sore and I educated her on the importace despite some discomfort of good posture while she is healing.     Stairs Stairs: Yes Stairs assistance: Min assist Stair Management: One rail Left;Sideways;Step to pattern Number of Stairs: 5 General stair comments: simulated home stair entry with only one railing on the left to enter her home.  Pt needed min assist for safety due to pain in bil legs and weakness (generalized).           Balance Overall balance assessment: Needs assistance Sitting-balance support: Feet supported;No upper extremity supported Sitting balance-Leahy Scale: Good     Standing balance support: No upper extremity supported;Bilateral upper extremity supported Standing balance-Leahy Scale: Fair                      Cognition Arousal/Alertness: Awake/alert Behavior During Therapy: Flat affect Overall Cognitive Status: Within Functional Limits for tasks assessed                      Exercises Other Exercises Other Exercises: I asked if she thought the exercises done last session helped her pain and she said "yes".  I encouraged her to do some gentle shoulder blade exercises.          Pertinent Vitals/Pain Pain Assessment: 0-10 Pain Score: 9  Pain Location: incision, back of neck into shoulders Pain Descriptors / Indicators: Aching;Burning Pain Intervention(s): Limited activity within patient's tolerance;Monitored during session;Repositioned;Premedicated before session  PT Goals (current goals can now be found in the care plan section) Acute Rehab PT Goals Patient Stated Goal: to go home and care for herself as her husband is really not able to care for her Progress towards PT goals: Progressing toward goals    Frequency  Min 5X/week    PT Plan Current plan remains appropriate       End of Session   Activity Tolerance: Patient limited by  fatigue;Patient limited by pain Patient left: in bed;with call bell/phone within reach     Time: 0941-1006 PT Time Calculation (min): 25 min  Charges:  $Gait Training: 23-37 mins                      Laiden Milles B. Pardeep Pautz, PT, DPT [phone]   12/19/2013, 10:17 AM

## 2013-12-19 NOTE — Progress Notes (Signed)
UR complete.  Jalyric Kaestner RN, MSN 

## 2013-12-19 NOTE — Progress Notes (Signed)
Patient ID: Carmen Deleon, female   DOB: 12-04-1955, 58 y.o.   MRN: [id number] Vital signs are stable. Patient appears to be tolerating things well Plan discharge for the a.m. as I will not be available tomorrow Discussed plan with patient. She is agreeable

## 2013-12-20 NOTE — Progress Notes (Signed)
Patient's family stated that they would rather pick up the prescription at the pharmacy the prescription is waiting at. MD office contacted again and notified that the patient and family changed their minds and to disregard the need to call in the [place] prescription to the [facility] pharmacy.

## 2013-12-20 NOTE — Progress Notes (Signed)
Patient given DC instructions. Patient states she did not want to get prescription at pharmacy listed in chart. MD office contacted and office staff said MD would call in prescription as soon as MD finished in surgery. MD office number and [place] phone number written in on DC instructions.Patient home medication retrieved from pharmacy as well as dose dispensed for this evening. Answered all patient and family questions.

## 2013-12-20 NOTE — Progress Notes (Signed)
PT Cancellation Note  Patient Details Name: Carmen Deleon MRN: [id number] DOB: 1955-03-18   Cancelled Treatment:    Reason Eval/Treat Not Completed: Other (comment) (to ready for discharge. ).  Spoke with pt, answered any last minute questions and pt did not feel the need to practice any mobility.  Verbally reviewed her precautions and the importance of upright posture.   Thanks,    Barbarann Ehlers. Gillie [name], PT, DPT [phone]   12/20/2013, 9:57 AM

## 2014-01-17 ENCOUNTER — Ambulatory Visit: Payer: Medicaid Other | Admitting: Physical Medicine & Rehabilitation

## 2014-02-16 ENCOUNTER — Other Ambulatory Visit: Payer: Self-pay | Admitting: Neurological Surgery

## 2014-02-16 DIAGNOSIS — M5000 Cervical disc disorder with myelopathy, unspecified cervical region: Secondary | ICD-10-CM

## 2014-03-02 ENCOUNTER — Other Ambulatory Visit: Payer: Medicaid Other

## 2014-03-14 ENCOUNTER — Ambulatory Visit
Admission: RE | Admit: 2014-03-14 | Discharge: 2014-03-14 | Disposition: A | Discharge status: Home / Self Care | Payer: Medicaid Other | Source: Ambulatory Visit | Attending: Neurological Surgery | Admitting: Neurological Surgery

## 2014-03-14 DIAGNOSIS — M5000 Cervical disc disorder with myelopathy, unspecified cervical region: Secondary | ICD-10-CM

## 2014-05-19 ENCOUNTER — Ambulatory Visit: Payer: Medicaid Other | Admitting: Physical Medicine & Rehabilitation

## 2014-06-19 ENCOUNTER — Encounter: Payer: Medicaid Other | Attending: Physical Medicine & Rehabilitation

## 2014-06-19 ENCOUNTER — Other Ambulatory Visit: Payer: Self-pay | Admitting: Physical Medicine & Rehabilitation

## 2014-06-19 ENCOUNTER — Encounter: Payer: Self-pay | Admitting: Physical Medicine & Rehabilitation

## 2014-06-19 ENCOUNTER — Ambulatory Visit (HOSPITAL_BASED_OUTPATIENT_CLINIC_OR_DEPARTMENT_OTHER): Payer: Medicaid Other | Admitting: Physical Medicine & Rehabilitation

## 2014-06-19 VITALS — BP 112/72 | HR 89 | Resp 14

## 2014-06-19 DIAGNOSIS — G894 Chronic pain syndrome: Secondary | ICD-10-CM

## 2014-06-19 DIAGNOSIS — G950 Syringomyelia and syringobulbia: Secondary | ICD-10-CM | POA: Diagnosis not present

## 2014-06-19 DIAGNOSIS — M25552 Pain in left hip: Secondary | ICD-10-CM | POA: Insufficient documentation

## 2014-06-19 DIAGNOSIS — Z79899 Other long term (current) drug therapy: Secondary | ICD-10-CM | POA: Insufficient documentation

## 2014-06-19 DIAGNOSIS — M7582 Other shoulder lesions, left shoulder: Secondary | ICD-10-CM | POA: Diagnosis not present

## 2014-06-19 DIAGNOSIS — M797 Fibromyalgia: Secondary | ICD-10-CM | POA: Insufficient documentation

## 2014-06-19 DIAGNOSIS — G8929 Other chronic pain: Secondary | ICD-10-CM | POA: Diagnosis not present

## 2014-06-19 DIAGNOSIS — Z5181 Encounter for therapeutic drug level monitoring: Secondary | ICD-10-CM | POA: Diagnosis not present

## 2014-06-19 MED ORDER — PREGABALIN 75 MG PO CAPS: 75.0000 mg | ORAL_CAPSULE | Freq: Three times a day (TID) | ORAL | Status: AC

## 2014-06-19 NOTE — Progress Notes (Signed)
Subjective:    Patient ID: Carmen Deleon, female    DOB: 11-15-1955, 59 y.o.   MRN: [id number]  HPI The patient is a 59 year old woman with multiple pain complaints. She has a known cervical/thoracic syrinx and has seen neurosurgery ( Dr. Kevan Ny) who is currently following. She has a long history of fibromyalgia. She has Tarlov cysts. She is chronic shoulder pain/rotator cuff problems.   Chief complaint: Neck pain and back pain as well as left-sided upper extremity and lower extremity pain.  Review of sleep seen by Dr. Dan Maker at this clinic with a diagnosis of bilateral medial epicondylitis, Left trochanteric bursitis, left shoulder tendinitis  Patient complains of having had Tarlov cysts in the neck as well as the low back. Last MRIs from 2013 on the low back showed no evidence of Tarlov cyst, the MRI from 2016 of the cervical spine did not show any.There was a syrinx seen on the most recent cervical MRI which is unchanged compared to prior.  Patient also has visual complaints where she has some blurring of vision that is episodic. She has had evaluation by ophthalmology, neurology as well as neurosurgery. MRI of the brain was normal. No definitive pathology was identified from these consultations.  Review of Systems     Objective:   Physical Exam  Constitutional: She is oriented to person, place, and time. She appears well-developed and well-nourished.  HENT:  Head: Normocephalic and atraumatic.  Eyes: Conjunctivae and EOM are normal. Pupils are equal, round, and reactive to light.  Musculoskeletal:       Right shoulder: Normal.       Left shoulder: She exhibits tenderness.       Cervical back: She exhibits decreased range of motion. She exhibits no tenderness.  Positive impingement signs left shoulder Pain with internal/external rotation of the shoulder with decreased range of motion  Neurological: She is alert and oriented to person, place, and time. She displays no  atrophy. She exhibits normal muscle tone. Coordination and gait normal.  5/5 strength in bilateral biceps triceps grip her left shoulder has guarding 3 minus related to pain, 5/5 right deltoid 5/5 bilateral hip flexor knee extensor and ankle dorsiflexors  Negative straight leg raising get some pulling behind the left hamstring with  Psychiatric: Her mood appears anxious.  Nursing note and vitals reviewed.         Assessment & Plan:    1. Left Shoulder Pain may be multifactorial, rotator cuff tendinitis/bursitis, In addition her left deltoid is firm and tender but no evidence of erythema. I doubt infection. Difficult to say whether this is just guarding secondary to pain.We will ask orthopedics to evaluate   2. Right shoulder pain and decreased ROM significantly improved after injection, about 2 years ago.    3. Diffuse pain: Etiology may be multifactorial she has a history of fibromyalgia .Doubt that this is related to syringomyelia. Size of this has not increased with time. She will follow-up with Dr. Ellene Route  4. Cervical post laminectomy syndrome status post recent C5-6 C6-7 fusion still recovering from this. No obvious radiculopathy. Post op MRI showed intact fusion with no postoperative complications  5. Chronic low back pain MRI from 2013 reviewed this is really quite unremarkable I do not see any evidence of Tarlov cyst that she described from a previous MRI,  MRI from 2012 at [facility] of the pelvis, This was unremarkable there was incidental finding of Tarlov cyst which are typically not symptomatic.  Do not think  there is any physical exam findings consistent with radiculopathy ORT 6

## 2014-06-19 NOTE — Progress Notes (Signed)
Subjective:    Patient ID: Carmen Deleon, female    DOB: 02-07-1956, 59 y.o.   MRN: [id number]  HPI   Pain Inventory Average Pain 8 Pain Right Now 9 My pain is constant, burning, dull, stabbing, tingling and aching  In the last 24 hours, has pain interfered with the following? General activity 9 Relation with others 8 Enjoyment of life 8 What TIME of day is your pain at its worst? all Sleep (in general) Poor  Pain is worse with: walking, bending, sitting, inactivity, standing and some activites Pain improves with: rest and heat/ice Relief from Meds: 0  Mobility walk without assistance how many minutes can you walk? 30-45 ability to climb steps?  yes do you drive?  yes  Function disabled: date disabled .  Neuro/Psych numbness tremor tingling trouble walking spasms dizziness depression anxiety  Prior Studies Any changes since last visit?  no  Physicians involved in your care Any changes since last visit?  no   Family History  Problem Relation Age of Onset  . Stroke Mother   . Heart disease Mother   . Cancer Mother   . Hypertension Mother   . Arthritis Mother   . Depression Mother   . Cancer Father   . Arthritis Father   . Depression Father    History   Social History  . Marital Status: Married    Spouse Name: N/A  . Number of Children: N/A  . Years of Education: N/A   Social History Main Topics  . Smoking status: Former Research scientist (life sciences)  . Smokeless tobacco: Never Used  . Alcohol Use: No  . Drug Use: No  . Sexual Activity: Not on file   Other Topics Concern  . None   Social History Narrative   Past Surgical History  Procedure Laterality Date  . Abdominal hysterectomy    . Colonoscopy with esophagogastroduodenoscopy (egd)    . Cardiovascular stress test      05/03/13 ([facility]): probable NL, but can't r/o mild apical ischemia, EF 82%-->Cardiac CTA 05/20/13: Calcium score 8.9, right dominant system, normal coronaries  . Anterior  cervical decomp/discectomy fusion N/A 12/12/2013    Procedure: ANTERIOR CERVICAL DECOMPRESSION/DISCECTOMY FUSION 2 LEVELS;  Surgeon: Kristeen Miss, MD;  Location: [facility];  Service: Neurosurgery;  Laterality: N/A;  C5-6 C6-7 Anterior cervical decompression/diskectomy/fusion   Past Medical History  Diagnosis Date  . Depression   . Hyperlipidemia   . Neuromuscular disorder   . AC (acromioclavicular) joint bone spurs   . Pinched nerve in neck   . PONV (postoperative nausea and vomiting)   . Difficult intubation 2006    with first endoscopy; did ok with endoscopy 10/15  . Dysphagia     cannot swallow large pills, bread etc  . GERD (gastroesophageal reflux disease)   . Gastroparesis   . Palpitations   . Hx-TIA (transient ischemic attack) 2010  . Shortness of breath     exertion  . Anxiety   . Headache   . Migraine   . Fibromyalgia   . Arthritis   . Heart murmur     echo 04/26/13: NL LVF, EF 84%, NL wall motion, mild PR, mild-mod TR, mild pulm HTN, mod MR, mild-mod AR (Dr. Neoma Laming)   BP 112/72 mmHg  Pulse 89  Resp 14  SpO2 98%  Opioid Risk Score:   Fall Risk Score:  `1  Depression screen PHQ 2/9  Depression screen PHQ 2/9 06/19/2014  Decreased Interest 2  Down, Depressed, Hopeless  2  PHQ - 2 Score 4  Altered sleeping 3  Tired, decreased energy 2  Change in appetite 1  Feeling bad or failure about yourself  3  Trouble concentrating 1  Moving slowly or fidgety/restless 0  Suicidal thoughts 0  PHQ-9 Score 14     Review of Systems  Neurological: Positive for dizziness, tremors and numbness.       Tingling Spasms   Psychiatric/Behavioral: Positive for dysphoric mood.       Objective:   Physical Exam        Assessment & Plan:

## 2014-06-20 ENCOUNTER — Telehealth: Payer: Self-pay | Admitting: *Deleted

## 2014-06-20 LAB — PMP ALCOHOL METABOLITE (ETG): Ethyl Glucuronide (EtG): NEGATIVE ng/mL

## 2014-06-20 NOTE — Telephone Encounter (Signed)
Carmen Deleon called back complaining of her leg hurting her so bad that she cannot apply pressure or stand on it in any way.  She said it was bent during exam and she yelped because it hurt and is asking what she can do.  I spoke with her and offered her an appointment with Zella Ball our NP but she declined. She is saying she has not been able to bend the knee and cannot do things like be on the knee like kneeling etc, so that it sounds more like soreness of muscle more than anything. I suggested heat or ice  (she mentioned swelling) and muscle rub like ben gay or icy hot to help until the soreness leaves.She declined the appointment saying "I'll just try to help myself the best I can".

## 2014-06-22 LAB — BENZODIAZEPINES (GC/LC/MS), URINE
Alprazolam metabolite (GC/LC/MS), ur confirm: NEGATIVE ng/mL (ref <25)
CLONAZEPAU: NEGATIVE ng/mL (ref <25)
Flurazepam metabolite (GC/LC/MS), ur confirm: NEGATIVE ng/mL (ref <50)
Lorazepam (GC/LC/MS), ur confirm: NEGATIVE ng/mL (ref <50)
Midazolam (GC/LC/MS), ur confirm: NEGATIVE ng/mL (ref <50)
NORDIAZEPAMU: 156 ng/mL — AB (ref <50)
OXAZEPAMU: 134 ng/mL — AB (ref <50)
TEMAZEPAMU: 210 ng/mL — AB (ref <50)
Triazolam metabolite (GC/LC/MS), ur confirm: NEGATIVE ng/mL (ref <50)

## 2014-06-23 LAB — PRESCRIPTION MONITORING PROFILE (SOLSTAS)
Amphetamine/Meth: NEGATIVE ng/mL
BARBITURATE SCREEN, URINE: NEGATIVE ng/mL
BUPRENORPHINE, URINE: NEGATIVE ng/mL
CANNABINOID SCRN UR: NEGATIVE ng/mL
Carisoprodol, Urine: NEGATIVE ng/mL
Cocaine Metabolites: NEGATIVE ng/mL
Creatinine, Urine: 19.63 mg/dL — ABNORMAL LOW (ref >20.0)
FENTANYL URINE: NEGATIVE ng/mL
MDMA URINE: NEGATIVE ng/mL
METHADONE SCREEN, URINE: NEGATIVE ng/mL
Meperidine, Ur: NEGATIVE ng/mL
Nitrites, Initial: NEGATIVE ug/mL
Opiate Screen, Urine: NEGATIVE ng/mL
Oxycodone Screen, Ur: NEGATIVE ng/mL
Propoxyphene: NEGATIVE ng/mL
Tapentadol, urine: NEGATIVE ng/mL
Tramadol Scrn, Ur: NEGATIVE ng/mL
ZOLPIDEM, URINE: NEGATIVE ng/mL
pH, Initial: 5.3 pH (ref 4.5–8.9)

## 2014-07-06 NOTE — Progress Notes (Signed)
Urine drug screen for this encounter is consistent for prescribed medication diazepam.

## 2014-08-15 ENCOUNTER — Other Ambulatory Visit: Payer: Self-pay | Admitting: Family Medicine

## 2014-08-15 DIAGNOSIS — Z1239 Encounter for other screening for malignant neoplasm of breast: Secondary | ICD-10-CM

## 2014-08-16 ENCOUNTER — Other Ambulatory Visit: Payer: Self-pay | Admitting: Family Medicine

## 2014-08-16 DIAGNOSIS — M25512 Pain in left shoulder: Secondary | ICD-10-CM

## 2014-08-18 ENCOUNTER — Ambulatory Visit: Payer: Medicaid Other

## 2014-08-22 ENCOUNTER — Ambulatory Visit
Admission: RE | Admit: 2014-08-22 | Discharge: 2014-08-22 | Disposition: A | Discharge status: Home / Self Care | Payer: Medicaid Other | Source: Ambulatory Visit | Attending: Family Medicine | Admitting: Family Medicine

## 2014-08-22 DIAGNOSIS — Z1231 Encounter for screening mammogram for malignant neoplasm of breast: Secondary | ICD-10-CM | POA: Diagnosis present

## 2014-08-22 DIAGNOSIS — Z1239 Encounter for other screening for malignant neoplasm of breast: Secondary | ICD-10-CM

## 2014-08-23 ENCOUNTER — Ambulatory Visit
Admission: RE | Admit: 2014-08-23 | Discharge: 2014-08-23 | Disposition: A | Discharge status: Home / Self Care | Payer: Medicaid Other | Source: Ambulatory Visit | Attending: Family Medicine | Admitting: Family Medicine

## 2014-08-23 DIAGNOSIS — M67814 Other specified disorders of tendon, left shoulder: Secondary | ICD-10-CM | POA: Insufficient documentation

## 2014-08-23 DIAGNOSIS — M25512 Pain in left shoulder: Secondary | ICD-10-CM

## 2014-08-23 DIAGNOSIS — M19012 Primary osteoarthritis, left shoulder: Secondary | ICD-10-CM

## 2014-08-23 DIAGNOSIS — Z1231 Encounter for screening mammogram for malignant neoplasm of breast: Principal | ICD-10-CM

## 2014-09-18 ENCOUNTER — Ambulatory Visit: Payer: Medicaid Other | Admitting: Physical Medicine & Rehabilitation

## 2014-09-18 ENCOUNTER — Encounter: Payer: Self-pay | Admitting: *Deleted

## 2014-09-19 ENCOUNTER — Encounter: Payer: Self-pay | Admitting: Anesthesiology

## 2014-09-29 ENCOUNTER — Ambulatory Visit: Admit: 2014-09-29 | Payer: Self-pay | Admitting: Unknown Physician Specialty

## 2014-09-29 HISTORY — DX: Family history of other specified conditions: Z84.89

## 2014-09-29 HISTORY — DX: Motion sickness, initial encounter: T75.3XXA

## 2014-09-29 SURGERY — ARTHROSCOPY, SHOULDER WITH REPAIR, ROTATOR CUFF, OPEN
Anesthesia: Choice | Laterality: Left

## 2014-10-11 ENCOUNTER — Encounter
Admission: RE | Admit: 2014-10-11 | Discharge: 2014-10-11 | Disposition: A | Discharge status: Home / Self Care | Payer: Medicaid Other | Source: Ambulatory Visit | Attending: Unknown Physician Specialty | Admitting: Unknown Physician Specialty

## 2014-10-11 DIAGNOSIS — Z01810 Encounter for preprocedural cardiovascular examination: Secondary | ICD-10-CM | POA: Insufficient documentation

## 2014-10-11 DIAGNOSIS — Z01812 Encounter for preprocedural laboratory examination: Secondary | ICD-10-CM | POA: Insufficient documentation

## 2014-10-11 HISTORY — DX: Other symptoms and signs involving the musculoskeletal system: R29.898

## 2014-10-11 HISTORY — DX: Paresthesia of skin: R20.2

## 2014-10-11 HISTORY — DX: Hydromyelia: Q06.4

## 2014-10-11 HISTORY — DX: Chest pain, unspecified: R07.9

## 2014-10-11 HISTORY — DX: Rheumatic mitral valve disease, unspecified: I05.9

## 2014-10-11 HISTORY — DX: Irritable bowel syndrome, unspecified: K58.9

## 2014-10-11 LAB — CBC
HEMATOCRIT: 40.5 % (ref 35.0–47.0)
HEMOGLOBIN: 13.5 g/dL (ref 12.0–16.0)
MCH: 29.9 pg (ref 26.0–34.0)
MCHC: 33.3 g/dL (ref 32.0–36.0)
MCV: 90 fL (ref 80.0–100.0)
Platelets: 213 10*3/uL (ref 150–440)
RBC: 4.5 MIL/uL (ref 3.80–5.20)
RDW: 13.2 % (ref 11.5–14.5)
WBC: 8.3 10*3/uL (ref 3.6–11.0)

## 2014-10-11 LAB — BASIC METABOLIC PANEL
ANION GAP: 10 (ref 5–15)
BUN: 14 mg/dL (ref 6–20)
CO2: 27 mmol/L (ref 22–32)
CREATININE: 0.89 mg/dL (ref 0.44–1.00)
Calcium: 9.1 mg/dL (ref 8.9–10.3)
Chloride: 95 mmol/L — ABNORMAL LOW (ref 101–111)
GFR calc Af Amer: >60 mL/min (ref >60)
GFR calc non Af Amer: >60 mL/min (ref >60)
Glucose, Bld: 84 mg/dL (ref 65–99)
Potassium: 3.7 mmol/L (ref 3.5–5.1)
Sodium: 132 mmol/L — ABNORMAL LOW (ref 135–145)

## 2014-10-11 NOTE — Patient Instructions (Signed)
  Your procedure is scheduled on: 10/16/14 Report to Day Surgery.[facility]OND FLOOR To find out your arrival time please call [phone] between 1PM - 3PM on 10/13/14 Remember: Instructions that are not followed completely may result in serious medical risk, up to and including death, or upon the discretion of your surgeon and anesthesiologist your surgery may need to be rescheduled.    X___ 1. Do not eat food or drink liquids after midnight. No gum chewing or hard candies.     __X__ 2. No Alcohol for 24 hours before or after surgery.   ____ 3. Bring all medications with you on the day of surgery if instructed.    __X__ 4. Notify your doctor if there is any change in your medical condition     (cold, fever, infections).     Do not wear jewelry, make-up, hairpins, clips or nail polish.  Do not wear lotions, powders, or perfumes. You may wear deodorant.  Do not shave 48 hours prior to surgery. Men may shave face and neck.  Do not bring valuables to the hospital.    [facility] is not responsible for any belongings or valuables.               Contacts, dentures or bridgework may not be worn into surgery.  Leave your suitcase in the car. After surgery it may be brought to your room.  For patients admitted to the hospital, discharge time is determined by your                treatment team.   Patients discharged the day of surgery will not be allowed to drive home.   Please read over the following fact sheets that you were given:   Surgical Site Infection Prevention   ____ Take these medicines the morning of surgery with A SIP OF WATER:    1. OMEPRAZOLE  2. LYRICA  3. VALIUM  4.  5.  6.  ____ Fleet Enema (as directed)   ____ Use CHG Soap as directed  ____ Use inhalers on the day of surgery  ____ Stop metformin 2 days prior to surgery    ____ Take 1/2 of usual insulin dose the night before surgery and none on the morning of surgery.   _X___ Stop  Coumadin/Plavix/aspirin on   STOP ASPIRIN 1 WEEK BEFORE SURGERY ____ Stop Anti-inflammatories on    __X__ Stop supplements until after surgery.  STOP MELATONIN UNTIL AFTER DURGERY  ____ Bring C-Pap to the hospital.

## 2014-10-12 ENCOUNTER — Inpatient Hospital Stay: Admission: RE | Admit: 2014-10-12 | Payer: Medicaid Other | Source: Ambulatory Visit

## 2014-10-12 NOTE — OR Nursing (Signed)
CLEARED BY NEURO

## 2014-10-16 ENCOUNTER — Ambulatory Visit: Payer: Medicaid Other | Admitting: Anesthesiology

## 2014-10-16 ENCOUNTER — Encounter: Payer: Self-pay | Admitting: *Deleted

## 2014-10-16 ENCOUNTER — Ambulatory Visit
Admission: RE | Admit: 2014-10-16 | Discharge: 2014-10-16 | Disposition: A | Discharge status: Home / Self Care | Payer: Medicaid Other | Source: Ambulatory Visit | Attending: Unknown Physician Specialty | Admitting: Unknown Physician Specialty

## 2014-10-16 ENCOUNTER — Encounter
Admission: RE | Disposition: A | Discharge status: Home / Self Care | Payer: Self-pay | Source: Ambulatory Visit | Attending: Unknown Physician Specialty

## 2014-10-16 DIAGNOSIS — Z841 Family history of disorders of kidney and ureter: Secondary | ICD-10-CM | POA: Diagnosis not present

## 2014-10-16 DIAGNOSIS — Z885 Allergy status to narcotic agent status: Secondary | ICD-10-CM | POA: Insufficient documentation

## 2014-10-16 DIAGNOSIS — K219 Gastro-esophageal reflux disease without esophagitis: Secondary | ICD-10-CM | POA: Diagnosis not present

## 2014-10-16 DIAGNOSIS — Z8489 Family history of other specified conditions: Secondary | ICD-10-CM | POA: Insufficient documentation

## 2014-10-16 DIAGNOSIS — G589 Mononeuropathy, unspecified: Secondary | ICD-10-CM | POA: Insufficient documentation

## 2014-10-16 DIAGNOSIS — E785 Hyperlipidemia, unspecified: Secondary | ICD-10-CM | POA: Insufficient documentation

## 2014-10-16 DIAGNOSIS — M797 Fibromyalgia: Secondary | ICD-10-CM | POA: Diagnosis not present

## 2014-10-16 DIAGNOSIS — Q064 Hydromyelia: Secondary | ICD-10-CM | POA: Insufficient documentation

## 2014-10-16 DIAGNOSIS — Z90710 Acquired absence of both cervix and uterus: Secondary | ICD-10-CM | POA: Diagnosis not present

## 2014-10-16 DIAGNOSIS — Z881 Allergy status to other antibiotic agents status: Secondary | ICD-10-CM | POA: Diagnosis not present

## 2014-10-16 DIAGNOSIS — Z8261 Family history of arthritis: Secondary | ICD-10-CM | POA: Insufficient documentation

## 2014-10-16 DIAGNOSIS — Z823 Family history of stroke: Secondary | ICD-10-CM | POA: Diagnosis not present

## 2014-10-16 DIAGNOSIS — Z7982 Long term (current) use of aspirin: Secondary | ICD-10-CM | POA: Insufficient documentation

## 2014-10-16 DIAGNOSIS — M19012 Primary osteoarthritis, left shoulder: Secondary | ICD-10-CM | POA: Diagnosis not present

## 2014-10-16 DIAGNOSIS — Z8249 Family history of ischemic heart disease and other diseases of the circulatory system: Secondary | ICD-10-CM | POA: Diagnosis not present

## 2014-10-16 DIAGNOSIS — A6009 Herpesviral infection of other urogenital tract: Secondary | ICD-10-CM | POA: Diagnosis not present

## 2014-10-16 DIAGNOSIS — Z888 Allergy status to other drugs, medicaments and biological substances status: Secondary | ICD-10-CM | POA: Insufficient documentation

## 2014-10-16 DIAGNOSIS — I059 Rheumatic mitral valve disease, unspecified: Secondary | ICD-10-CM | POA: Diagnosis not present

## 2014-10-16 DIAGNOSIS — M7522 Bicipital tendinitis, left shoulder: Secondary | ICD-10-CM | POA: Insufficient documentation

## 2014-10-16 DIAGNOSIS — K589 Irritable bowel syndrome without diarrhea: Secondary | ICD-10-CM | POA: Insufficient documentation

## 2014-10-16 DIAGNOSIS — M1990 Unspecified osteoarthritis, unspecified site: Secondary | ICD-10-CM | POA: Insufficient documentation

## 2014-10-16 DIAGNOSIS — Z79899 Other long term (current) drug therapy: Secondary | ICD-10-CM | POA: Diagnosis not present

## 2014-10-16 DIAGNOSIS — Z9889 Other specified postprocedural states: Secondary | ICD-10-CM | POA: Insufficient documentation

## 2014-10-16 DIAGNOSIS — Z801 Family history of malignant neoplasm of trachea, bronchus and lung: Secondary | ICD-10-CM | POA: Insufficient documentation

## 2014-10-16 DIAGNOSIS — Z791 Long term (current) use of non-steroidal anti-inflammatories (NSAID): Secondary | ICD-10-CM | POA: Diagnosis not present

## 2014-10-16 DIAGNOSIS — Z8673 Personal history of transient ischemic attack (TIA), and cerebral infarction without residual deficits: Secondary | ICD-10-CM | POA: Insufficient documentation

## 2014-10-16 DIAGNOSIS — G8929 Other chronic pain: Secondary | ICD-10-CM | POA: Diagnosis not present

## 2014-10-16 DIAGNOSIS — M25512 Pain in left shoulder: Secondary | ICD-10-CM | POA: Diagnosis present

## 2014-10-16 HISTORY — PX: SHOULDER ARTHROSCOPY WITH ROTATOR CUFF REPAIR AND SUBACROMIAL DECOMPRESSION: SHX5686

## 2014-10-16 SURGERY — SHOULDER ARTHROSCOPY WITH ROTATOR CUFF REPAIR AND SUBACROMIAL DECOMPRESSION
Anesthesia: General | Site: Shoulder | Laterality: Left | Wound class: Clean

## 2014-10-16 MED ORDER — PROPOFOL 10 MG/ML IV BOLUS
INTRAVENOUS | Status: DC | PRN
  Administered 2014-10-16: 120 mg via INTRAVENOUS

## 2014-10-16 MED ORDER — SCOPOLAMINE 1 MG/3DAYS TD PT72
1.0000 | MEDICATED_PATCH | TRANSDERMAL | Status: DC
  Administered 2014-10-16: 1.5 mg via TRANSDERMAL

## 2014-10-16 MED ORDER — MORPHINE SULFATE 2 MG/ML IJ SOLN
INTRAMUSCULAR | Status: AC
  Filled 2014-10-16: qty 1

## 2014-10-16 MED ORDER — NEOSTIGMINE METHYLSULFATE 10 MG/10ML IV SOLN
INTRAVENOUS | Status: DC | PRN
  Administered 2014-10-16: 2 mg via INTRAVENOUS

## 2014-10-16 MED ORDER — EPINEPHRINE HCL 1 MG/ML IJ SOLN
INTRAMUSCULAR | Status: AC
  Filled 2014-10-16: qty 1

## 2014-10-16 MED ORDER — FENTANYL CITRATE (PF) 100 MCG/2ML IJ SOLN
INTRAMUSCULAR | Status: AC
  Filled 2014-10-16: qty 2

## 2014-10-16 MED ORDER — PROMETHAZINE HCL 25 MG/ML IJ SOLN
6.2500 mg | INTRAMUSCULAR | Status: DC | PRN
  Administered 2014-10-16: 6.25 mg via INTRAVENOUS

## 2014-10-16 MED ORDER — ROCURONIUM BROMIDE 100 MG/10ML IV SOLN
INTRAVENOUS | Status: DC | PRN
  Administered 2014-10-16: 30 mg via INTRAVENOUS

## 2014-10-16 MED ORDER — SUCCINYLCHOLINE CHLORIDE 20 MG/ML IJ SOLN
INTRAMUSCULAR | Status: DC | PRN
  Administered 2014-10-16: 60 mg via INTRAVENOUS

## 2014-10-16 MED ORDER — SCOPOLAMINE 1 MG/3DAYS TD PT72
MEDICATED_PATCH | TRANSDERMAL | Status: AC
  Filled 2014-10-16: qty 1

## 2014-10-16 MED ORDER — ROPIVACAINE HCL 5 MG/ML IJ SOLN
INTRAMUSCULAR | Status: AC
  Administered 2014-10-16 (×2): 10 mL via PERINEURAL
  Filled 2014-10-16: qty 40

## 2014-10-16 MED ORDER — EPINEPHRINE HCL 1 MG/ML IJ SOLN
INTRAMUSCULAR | Status: DC | PRN
  Administered 2014-10-16: 1 mg via INTRAMUSCULAR

## 2014-10-16 MED ORDER — CEFAZOLIN SODIUM 1-5 GM-% IV SOLN
INTRAVENOUS | Status: DC | PRN
  Administered 2014-10-16: 1 g via INTRAVENOUS

## 2014-10-16 MED ORDER — PHENYLEPHRINE HCL 10 MG/ML IJ SOLN
INTRAMUSCULAR | Status: DC | PRN
  Administered 2014-10-16: 100 ug via INTRAVENOUS

## 2014-10-16 MED ORDER — ACETAMINOPHEN 10 MG/ML IV SOLN
INTRAVENOUS | Status: DC | PRN
  Administered 2014-10-16: 1000 mg via INTRAVENOUS

## 2014-10-16 MED ORDER — PROMETHAZINE HCL 25 MG/ML IJ SOLN
INTRAMUSCULAR | Status: AC
  Administered 2014-10-16: 6.25 mg via INTRAVENOUS
  Filled 2014-10-16: qty 1

## 2014-10-16 MED ORDER — SODIUM CHLORIDE 0.9 % IJ SOLN
INTRAMUSCULAR | Status: AC
  Filled 2014-10-16: qty 10

## 2014-10-16 MED ORDER — MORPHINE SULFATE (PF) 2 MG/ML IV SOLN
INTRAVENOUS | Status: AC
  Administered 2014-10-16: 2 mg via INTRAVENOUS
  Filled 2014-10-16: qty 1

## 2014-10-16 MED ORDER — MORPHINE SULFATE (PF) 4 MG/ML IV SOLN
INTRAVENOUS | Status: AC
  Filled 2014-10-16: qty 1

## 2014-10-16 MED ORDER — DEXAMETHASONE SODIUM PHOSPHATE 4 MG/ML IJ SOLN
INTRAMUSCULAR | Status: DC | PRN
  Administered 2014-10-16: 10 mg via INTRAVENOUS

## 2014-10-16 MED ORDER — SCOPOLAMINE 1 MG/3DAYS TD PT72: 1.0000 | MEDICATED_PATCH | TRANSDERMAL | Status: DC

## 2014-10-16 MED ORDER — GLYCOPYRROLATE 0.2 MG/ML IJ SOLN
INTRAMUSCULAR | Status: DC | PRN
  Administered 2014-10-16: 0.4 mg via INTRAVENOUS

## 2014-10-16 MED ORDER — LACTATED RINGERS IV SOLN
INTRAVENOUS | Status: DC
  Administered 2014-10-16: 09:00:00 via INTRAVENOUS

## 2014-10-16 MED ORDER — ONDANSETRON HCL 4 MG/2ML IJ SOLN
INTRAMUSCULAR | Status: DC | PRN
  Administered 2014-10-16: 4 mg via INTRAVENOUS

## 2014-10-16 MED ORDER — MIDAZOLAM HCL 5 MG/5ML IJ SOLN
INTRAMUSCULAR | Status: DC
Start: 2014-10-16 — End: 2014-10-16
  Filled 2014-10-16: qty 5

## 2014-10-16 MED ORDER — MORPHINE SULFATE 10 MG/ML IJ SOLN
2.0000 mg | Freq: Once | INTRAMUSCULAR | Status: AC | PRN
  Administered 2014-10-16: 2 mg via INTRAVENOUS

## 2014-10-16 MED ORDER — MIDAZOLAM HCL 5 MG/5ML IJ SOLN
2.0000 mg | Freq: Once | INTRAMUSCULAR | Status: AC
  Administered 2014-10-16: 2 mg via INTRAVENOUS

## 2014-10-16 MED ORDER — ACETAMINOPHEN 10 MG/ML IV SOLN
INTRAVENOUS | Status: AC
  Filled 2014-10-16: qty 100

## 2014-10-16 MED ORDER — LIDOCAINE HCL (PF) 1 % IJ SOLN
INTRAMUSCULAR | Status: AC
  Filled 2014-10-16: qty 5

## 2014-10-16 SURGICAL SUPPLY — 74 items
ADAPTER IRRIG TUBE 2 SPIKE SOL (ADAPTER) IMPLANT
ARTHROWAND PARAGON T2 (SURGICAL WAND)
BLADE ABRADER 4.5 (BLADE) ×3 IMPLANT
BLADE SHAVER 4.5X7 STR FR (MISCELLANEOUS) ×3 IMPLANT
BLADE SURG 15 STRL LF DISP TIS (BLADE) IMPLANT
BLADE SURG 15 STRL SS (BLADE)
BUR ABRADER 4.0 W/FLUTE AQUA (MISCELLANEOUS) ×1 IMPLANT
BUR ABRADER 5.5 BLK (MISCELLANEOUS) ×1 IMPLANT
BUR BR 5.5 WIDE MOUTH (BURR) ×3 IMPLANT
BUR HOODED 3.0 ABRASION (BURR) IMPLANT
BURR ABRADER 4.0 W/FLUTE AQUA (MISCELLANEOUS) ×3
BURR ABRADER 5.5 BLK (MISCELLANEOUS) ×3
CANNULA 8.5X75 THRED (CANNULA) IMPLANT
CANNULA SHAVER 8MMX76MM (CANNULA) IMPLANT
CAP LOCK ULTRA CANNULA (MISCELLANEOUS) ×3 IMPLANT
CUTTER CANN W/HOLE 4.5 (CUTTER) IMPLANT
DRAPE STERI 35X30 U-POUCH (DRAPES) ×3 IMPLANT
DURAPREP 26ML APPLICATOR (WOUND CARE) ×3 IMPLANT
GAUZE SPONGE 4X4 12PLY STRL (GAUZE/BANDAGES/DRESSINGS) ×6 IMPLANT
GLOVE BIO SURGEON STRL SZ7.5 (GLOVE) ×3 IMPLANT
GLOVE BIO SURGEON STRL SZ8 (GLOVE) ×3 IMPLANT
GLOVE INDICATOR 8.0 STRL GRN (GLOVE) ×3 IMPLANT
GOWN STRL REUS W/ TWL LRG LVL3 (GOWN DISPOSABLE) ×1 IMPLANT
GOWN STRL REUS W/TWL LRG LVL3 (GOWN DISPOSABLE) ×2
GOWN STRL REUS W/TWL LRG LVL4 (GOWN DISPOSABLE) ×3 IMPLANT
IV LACTATED RINGER IRRG 3000ML (IV SOLUTION) ×12
IV LR IRRIG 3000ML ARTHROMATIC (IV SOLUTION) ×6 IMPLANT
KIT SHOULDER TRACTION (DRAPES) ×3 IMPLANT
MANIFOLD 4PT FOR NEPTUNE1 (MISCELLANEOUS) ×3 IMPLANT
NEEDLE 18GX1X1/2 (RX/OR ONLY) (NEEDLE) IMPLANT
NEEDLE MAYO CATGUT SZ 1.5 (NEEDLE)
NEEDLE MAYO CATGUT SZ 2 (NEEDLE) IMPLANT
NEEDLE SPNL 18GX3.5 QUINCKE PK (NEEDLE) IMPLANT
PACK ARTHROSCOPY SHOULDER (MISCELLANEOUS) ×3 IMPLANT
PAD GROUND ADULT SPLIT (MISCELLANEOUS) ×3 IMPLANT
PASSER SUT CAPTURE FIRST (SUTURE) IMPLANT
SET TUBE SUCT SHAVER OUTFL 24K (TUBING) ×3 IMPLANT
SOL PREP PVP 2OZ (MISCELLANEOUS) ×3
SOLUTION PREP PVP 2OZ (MISCELLANEOUS) ×1 IMPLANT
STAPLER SKIN PROX 35W (STAPLE) IMPLANT
SUT ETHIBOND NAB CT1 #1 30IN (SUTURE) IMPLANT
SUT ETHILON 3-0 FS-10 30 BLK (SUTURE)
SUT MAGNUM WIRE 2 (SUTURE) IMPLANT
SUT PDS AB 1 CT1 27 (SUTURE) IMPLANT
SUT PDSII 0 (SUTURE) IMPLANT
SUT PERFECTPASSER WHITE CART (SUTURE) IMPLANT
SUT PROLENE 2 0 CT2 30 (SUTURE) IMPLANT
SUT SMART STITCH CARTRIDGE (SUTURE) IMPLANT
SUT TICRON 2-0 30IN 311381 (SUTURE) IMPLANT
SUT VIC AB 0 CT1 36 (SUTURE) IMPLANT
SUT VIC AB 0 CT2 27 (SUTURE) IMPLANT
SUT VIC AB 2-0 CT1 27 (SUTURE)
SUT VIC AB 2-0 CT1 TAPERPNT 27 (SUTURE) IMPLANT
SUT VIC AB 2-0 CT2 27 (SUTURE) IMPLANT
SUT VIC AB 2-0 SH 27 (SUTURE)
SUT VIC AB 2-0 SH 27XBRD (SUTURE) IMPLANT
SUT VIC AB 3-0 SH 27 (SUTURE)
SUT VIC AB 3-0 SH 27X BRD (SUTURE) IMPLANT
SUTURE EHLN 3-0 FS-10 30 BLK (SUTURE) IMPLANT
SUTURE MAGNUM WIRE 2X48 BLK (SUTURE) IMPLANT
SYRINGE 10CC LL (SYRINGE) IMPLANT
TAPE MICROFOAM 4IN (TAPE) ×3 IMPLANT
TUBING ARTHRO INFLOW-ONLY STRL (TUBING) ×3 IMPLANT
WAND 30 DEG SABER W/CORD (SURGICAL WAND) IMPLANT
WAND ARTHRO PARAGON T2 (SURGICAL WAND) IMPLANT
WAND COVAC 50 IFS (MISCELLANEOUS) IMPLANT
WAND COVATOR 20 (MISCELLANEOUS) IMPLANT
WAND HAND CNTRL MULTIVAC 50 (MISCELLANEOUS) IMPLANT
WAND HAND CNTRL MULTIVAC 90 (MISCELLANEOUS) ×3 IMPLANT
WAND MEGAVAC 90 (MISCELLANEOUS) IMPLANT
WAND TENDON TOPAZ 0 ANGL (MISCELLANEOUS) IMPLANT
WAND TOPAZ EPF  WAS Q (MISCELLANEOUS)
WAND TOPAZ EPF WAS Q (MISCELLANEOUS) IMPLANT
WIRE MAGNUM (SUTURE) IMPLANT

## 2014-10-16 NOTE — Anesthesia Postprocedure Evaluation (Signed)
  Anesthesia Post-op Note  Patient: Carmen Deleon  Procedure(s) Performed: Procedure(s): SHOULDER ARTHROSCOPY WITH ROTATOR CUFF REPAIR AND SUBACROMIAL DECOMPRESSION (Left)  Anesthesia type:General ETT  Patient location: PACU  Post pain: Pain level controlled  Post assessment: Post-op Vital signs reviewed, Patient's Cardiovascular Status Stable, Respiratory Function Stable, Patent Airway and No signs of Nausea or vomiting  Post vital signs: Reviewed and stable  Last Vitals:  Filed Vitals:   10/16/14 1339  BP: 130/63  Pulse: 65  Temp: 36 C  Resp: 16    Level of consciousness: awake, alert  and patient cooperative  Complications: No apparent anesthesia complications

## 2014-10-16 NOTE — Anesthesia Preprocedure Evaluation (Addendum)
Anesthesia Evaluation  Patient identified by MRN, date of birth, ID band Patient awake    Reviewed: Allergy & Precautions, H&P , NPO status , Patient's Chart, lab work & pertinent test results  History of Anesthesia Complications (+) PONV, DIFFICULT AIRWAY, Family history of anesthesia reaction and history of anesthetic complications  Airway Mallampati: III  TM Distance: >3 FB Neck ROM: limited    Dental  (+) Poor Dentition, Chipped, Missing   Pulmonary shortness of breath, COPDformer smoker,  breath sounds clear to auscultation  Pulmonary exam normal       Cardiovascular Exercise Tolerance: Good Normal cardiovascular exam+ Valvular Problems/Murmurs MR Rhythm:regular Rate:Normal     Neuro/Psych  Headaches, PSYCHIATRIC DISORDERS Anxiety Depression  Neuromuscular disease    GI/Hepatic negative GI ROS, Neg liver ROS, GERD-  ,  Endo/Other  negative endocrine ROS  Renal/GU negative Renal ROS  negative genitourinary   Musculoskeletal  (+) Arthritis -, Fibromyalgia -  Abdominal   Peds  Hematology negative hematology ROS (+)   Anesthesia Other Findings Past Medical History:   Depression                                                   Hyperlipidemia                                               AC (acromioclavicular) joint bone spurs                      Pinched nerve in neck                                        PONV (postoperative nausea and vomiting)                     Dysphagia                                                      Comment:cannot swallow large pills, bread etc   GERD (gastroesophageal reflux disease)                       Gastroparesis                                                Palpitations                                                 Hx-TIA (transient ischemic attack)              2010         Shortness of breath  Comment:exertion   Anxiety                                                       Arthritis                                                    Difficult intubation                            2006           Comment:with first endoscopy; did ok with endoscopy               10/15   Family history of adverse reaction to anesthes*                Comment:mother -PONV   Headache                                                     Migraine                                                       Comment:ocular    Heart murmur                                                   Comment:echo 04/26/13: NL LVF, EF 84%, NL wall motion,               mild PR, mild-mod TR, mild pulm HTN, mod MR,               mild-mod AR (Dr. Neoma Laming)   Motion sickness                                                Comment:boats   Mitral valve disorder                                        Hydromyelia                                                    Comment:CERVICAL,THORACIC SPINE   Paresthesia  Chest pain syndrome                                          IBS (irritable bowel syndrome)                               Neuromuscular disorder                                         Comment:no diagnosis   Fibromyalgia                                                 Decreased ROM of neck                                        Patient endorses decreased strength and sensation in left arm.  She states that morphine is the only narcotic that she can tolerate.  Reproductive/Obstetrics negative OB ROS                           Anesthesia Physical Anesthesia Plan  ASA: III  Anesthesia Plan: General ETT   Post-op Pain Management: GA combined w/ Regional for post-op pain   Induction:   Airway Management Planned:   Additional Equipment:   Intra-op Plan:   Post-operative Plan:   Informed Consent: I have reviewed the patients History and Physical, chart, labs and discussed the  procedure including the risks, benefits and alternatives for the proposed anesthesia with the patient or authorized representative who has indicated his/her understanding and acceptance.   Dental Advisory Given  Plan Discussed with: Anesthesiologist, CRNA and Surgeon  Anesthesia Plan Comments: (Patient informed that she is at a mildly increased risk for nerve damage from her nerve block 2/2 to her pre existing neurologic problems.  However, 2/2 her narcotic intolerance we feel that the benefits of the block outweigh the risks.  Patient voiced understanding.)       Anesthesia Quick Evaluation

## 2014-10-16 NOTE — Transfer of Care (Signed)
Immediate Anesthesia Transfer of Care Note  Patient: Carmen Deleon  Procedure(s) Performed: Procedure(s): SHOULDER ARTHROSCOPY WITH ROTATOR CUFF REPAIR AND SUBACROMIAL DECOMPRESSION (Left)  Patient Location: PACU  Anesthesia Type:GA combined with regional for post-op pain  Level of Consciousness: awake, alert  and oriented  Airway & Oxygen Therapy: Patient Spontanous Breathing and Patient connected to face mask oxygen  Post-op Assessment: Report given to RN  Post vital signs: Reviewed and stable  Last Vitals:  Filed Vitals:   10/16/14 1223  BP: 126/77  Pulse: 77  Temp: 36.4 C  Resp: 12    Complications: No apparent anesthesia complications

## 2014-10-16 NOTE — Op Note (Signed)
10/16/2014  1:32 PM  Patient:   Carmen Deleon  Pre-Op Diagnosis:   CHRONIC LEFT SHOULDER PAIN  Postoperative diagnosis: Impingement symptomatology left shoulder along with long head of the biceps tendinitis and before [facility] joint arthritis   Procedure: Arthroscopic subacromial decompression plus release of the long head of the biceps tendon followed by arthroscopic [facility] joint resection  Anesthesia: General endotracheal with interscalene block placed preoperatively by the anesthesiologist.  Findings: As above.   Complications: None  Estimated blood loss: negligible  Tourniquet time: None  Drains: None   Brief clinical note:  The patient's symptoms have progressed despite medications, activity modification, etc. The patient's history and examination are consistent with impingement symptomatology,  long head of biceps tendinitis and AC joint arthritis. These findings were confirmed by MRI scan. The patient presents at this time for definitive management of these shoulder symptoms.  Procedure: The patient was brought into the operating room and placed in the supine position. The patient then underwent general endotracheal intubation and anesthesia before being repositioned in the lateral decubitus position. The left shoulder was prepped and draped in usual fashion for shoulder procedure. The shoulder was supported with the Acufex shoulder suspension device.  10 pounds of traction was utilized. Preoperative antibiotics were administered. A timeout was performed . A posterior portal was created and the glenohumeral joint thoroughly inspected revealing no degenerative arthritis and an intact but slightly frayed labrum. There was a little fraying of the labral attachment of the long head of the biceps tendon.. An anterior portal was created. An ArthroCare wand was inserted and used to obtain hemostasis as well as to perform a limited synovectomy.The biceps tendon was evaluated and  then released from its labral attachment using an ArthroCare wand.  The scope was repositioned through the posterior portal into the subacromial space. A separate lateral portal was created using an outside-in technique. An ArthroCare 90 wand followed by a 4.0 full-radius resector was introduced and used to perform a subtotal bursectomy. The ArthroCare wand was then inserted and used to remove the periosteal tissue off the undersurface of the anterior third of the acromion as well as to recess the coracoacromial ligament from its attachment along the anterior and lateral margins of the acromion.  I introduced a 5.5 acromionizer through the posterior portal and used it to perform the decompression by removing the undersurface of the anterior third of the acromion. The instruments were then removed from the subacromial space.  I then resected about 5-6 mm of the distal clavicle using an acromionizer bur placed through the lateral portal and a 4 mm round bur placed through a portal just posterior to the [facility] joint. The instruments were removed from the shoulder at this time.   The portal sites  were closed using 3-O nylon sutures. A sterile bulky dressing was applied to the shoulder . This was followed by a sling. The patient was then awakened, extubated, and returned to the recovery room in satisfactory condition after tolerating the procedure well.  Blood loss was negligible.

## 2014-10-16 NOTE — H&P (Signed)
  H and P reviewed. No changes. Uploaded at later date. 

## 2014-10-16 NOTE — Discharge Instructions (Addendum)
General Anesthesia, Care After Refer to this sheet in the next few weeks. These instructions provide you with information on caring for yourself after your procedure. Your health care provider may also give you more specific instructions. Your treatment has been planned according to current medical practices, but problems sometimes occur. Call your health care provider if you have any problems or questions after your procedure. WHAT TO EXPECT AFTER THE PROCEDURE After the procedure, it is typical to experience:  Sleepiness.  Nausea and vomiting. HOME CARE INSTRUCTIONS  For the first 24 hours after general anesthesia:  Have a responsible person with you.  Do not drive a car. If you are alone, do not take public transportation.  Do not drink alcohol.  Do not take medicine that has not been prescribed by your health care provider.  Do not sign important papers or make important decisions.  You may resume a normal diet and activities as directed by your health care provider.  Change bandages (dressings) as directed.  If you have questions or problems that seem related to general anesthesia, call the hospital and ask for the anesthetist or anesthesiologist on call. SEEK MEDICAL CARE IF:  You have nausea and vomiting that continue the day after anesthesia.  You develop a rash. SEEK IMMEDIATE MEDICAL CARE IF:   You have difficulty breathing.  You have chest pain.  You have any allergic problems.  Document Released: 05/26/2000 Document Revised: 02/22/2013 Document Reviewed: 09/02/2012 [facility] Patient Information 2015 [place], [place]. This information is not intended to replace advice given to you by your health care provider. Make sure you discuss any questions you have with your health care provider. Diet: As you were doing prior to hospitalization   Shower:  May shower but keep the wounds dry, use an occlusive plastic wrap or extremity protector. NO SOAKING IN TUB.    Dressing:  You may remove your dressing 3 days after surgery. Then apply waterproof bandaids and change them after showering.   Activity:  Increase activity slowly as tolerated. Can drive when comfortable.    Sling: D/C when comfortable    RTC: 1 week  Ice pack as needed   To prevent constipation: you may use a stool softener such as - Miralax (over the counter) for constipation as needed.    To prevent venous clotting Take one 81 mg. ASA tablet  2X per day for about 2 weeks post surgery.  Precautions:  If you experience chest pain or shortness of breath - call 911 immediately for transfer to the hospital emergency department!!  If you develop a fever greater that 101 F, purulent drainage from wound, increased redness or drainage from wound, or calf pain -- Call the office at [phone]                                             Follow- Up Appointment:  Please call for an appointment to be seen in 1 wk.AMBULATORY SURGERY  DISCHARGE INSTRUCTIONS   1) The drugs that you were given will stay in your system until tomorrow so for the next 24 hours you should not:  A) Drive an automobile B) Make any legal decisions C) Drink any alcoholic beverage   2) You may resume regular meals tomorrow.  Today it is better to start with liquids and gradually work up to solid foods.  You may eat anything  you prefer, but it is better to start with liquids, then soup and crackers, and gradually work up to solid foods.   3) Please notify your doctor immediately if you have any unusual bleeding, trouble breathing, redness and pain at the surgery site, drainage, fever, or pain not relieved by medication.    4) Additional Instructions:        Please contact your physician with any problems or Same Day Surgery at [phone], Monday through Friday 6 am to 4 pm, or [place] at [facility] number at [phone].AMBULATORY SURGERY  DISCHARGE INSTRUCTIONS   5) The drugs that you were  given will stay in your system until tomorrow so for the next 24 hours you should not:  D) Drive an automobile E) Make any legal decisions F) Drink any alcoholic beverage   6) You may resume regular meals tomorrow.  Today it is better to start with liquids and gradually work up to solid foods.  You may eat anything you prefer, but it is better to start with liquids, then soup and crackers, and gradually work up to solid foods.   7) Please notify your doctor immediately if you have any unusual bleeding, trouble breathing, redness and pain at the surgery site, drainage, fever, or pain not relieved by medication.    8) Additional Instructions:        Please contact your physician with any problems or Same Day Surgery at [phone], Monday through Friday 6 am to 4 pm, or [place] at [facility] number at [phone].

## 2014-10-16 NOTE — Anesthesia Procedure Notes (Addendum)
Anesthesia Regional Block:  Interscalene brachial plexus block  Pre-Anesthetic Checklist: ,, timeout performed, Correct Patient, Correct Site, Correct Laterality, Correct Procedure, Correct Position, site marked, Risks and benefits discussed,  Surgical consent,  Pre-op evaluation,  At surgeon's request and post-op pain management  Laterality: Left and Upper  Prep: alcohol swabs       Needles:  Injection technique: Single-shot  Needle Type: Stimiplex     Needle Length: 5cm 5 cm Needle Gauge: 22 and 22 G    Additional Needles:  Procedures: ultrasound guided (picture in chart) and nerve stimulator Interscalene brachial plexus block  Nerve Stimulator or Paresthesia:  Response: biceps flexion,   Additional Responses:   Narrative:  Start time: 10/16/2014 9:37 AM End time: 10/16/2014 9:40 AM Injection made incrementally with aspirations every 5 mL.  Performed by: Personally  Anesthesiologist: Katy Fitch K  Additional Notes: Functioning IV was confirmed and monitors were applied.  A 69mm 22ga Stimuplex needle was used. Sterile prep and drape,hand hygiene and sterile gloves were used.  Negative aspiration and negative test dose prior to incremental administration of local anesthetic. The patient tolerated the procedure well.      Procedure Name: Intubation Date/Time: 10/16/2014 10:48 AM Performed by: Aline Brochure Pre-anesthesia Checklist: Patient identified, Emergency Drugs available, Suction available and Patient being monitored Patient Re-evaluated:Patient Re-evaluated prior to inductionOxygen Delivery Method: Circle system utilized Preoxygenation: Pre-oxygenation with 100% oxygen Intubation Type: IV induction Ventilation: Mask ventilation without difficulty Laryngoscope Size: McGraph and 3 Grade View: Grade I Tube type: Oral Number of attempts: 1 Airway Equipment and Method: Patient positioned with wedge pillow,  Stylet and Video-laryngoscopy Placement  Confirmation: ETT inserted through vocal cords under direct vision,  positive ETCO2 and breath sounds checked- equal and bilateral Secured at: 20 cm Tube secured with: Tape Dental Injury: Teeth and Oropharynx as per pre-operative assessment

## 2014-10-16 NOTE — Progress Notes (Signed)
Taken to PACU [phone] for block - report given to [facility], RN

## 2015-08-17 ENCOUNTER — Emergency Department (HOSPITAL_COMMUNITY): Payer: Medicaid Other

## 2015-08-17 ENCOUNTER — Encounter (HOSPITAL_COMMUNITY): Payer: Self-pay

## 2015-08-17 ENCOUNTER — Emergency Department (HOSPITAL_COMMUNITY)
Admission: EM | Admit: 2015-08-17 | Discharge: 2015-08-17 | Disposition: A | Discharge status: Home / Self Care | Payer: Medicaid Other | Attending: Emergency Medicine | Admitting: Emergency Medicine

## 2015-08-17 DIAGNOSIS — E785 Hyperlipidemia, unspecified: Secondary | ICD-10-CM | POA: Insufficient documentation

## 2015-08-17 DIAGNOSIS — Y92410 Unspecified street and highway as the place of occurrence of the external cause: Secondary | ICD-10-CM | POA: Diagnosis not present

## 2015-08-17 DIAGNOSIS — Y939 Activity, unspecified: Secondary | ICD-10-CM | POA: Insufficient documentation

## 2015-08-17 DIAGNOSIS — Z87891 Personal history of nicotine dependence: Secondary | ICD-10-CM | POA: Insufficient documentation

## 2015-08-17 DIAGNOSIS — Z8673 Personal history of transient ischemic attack (TIA), and cerebral infarction without residual deficits: Secondary | ICD-10-CM | POA: Insufficient documentation

## 2015-08-17 DIAGNOSIS — R350 Frequency of micturition: Secondary | ICD-10-CM | POA: Insufficient documentation

## 2015-08-17 DIAGNOSIS — R42 Dizziness and giddiness: Secondary | ICD-10-CM | POA: Diagnosis not present

## 2015-08-17 DIAGNOSIS — M545 Low back pain: Secondary | ICD-10-CM | POA: Insufficient documentation

## 2015-08-17 DIAGNOSIS — S161XXA Strain of muscle, fascia and tendon at neck level, initial encounter: Secondary | ICD-10-CM | POA: Insufficient documentation

## 2015-08-17 DIAGNOSIS — Z79899 Other long term (current) drug therapy: Secondary | ICD-10-CM | POA: Diagnosis not present

## 2015-08-17 DIAGNOSIS — Y999 Unspecified external cause status: Secondary | ICD-10-CM | POA: Diagnosis not present

## 2015-08-17 DIAGNOSIS — S199XXA Unspecified injury of neck, initial encounter: Secondary | ICD-10-CM | POA: Diagnosis present

## 2015-08-17 DIAGNOSIS — Z791 Long term (current) use of non-steroidal anti-inflammatories (NSAID): Secondary | ICD-10-CM | POA: Diagnosis not present

## 2015-08-17 DIAGNOSIS — Z7982 Long term (current) use of aspirin: Secondary | ICD-10-CM | POA: Insufficient documentation

## 2015-08-17 LAB — URINALYSIS, ROUTINE W REFLEX MICROSCOPIC
Bilirubin Urine: NEGATIVE
GLUCOSE, UA: NEGATIVE mg/dL
HGB URINE DIPSTICK: NEGATIVE
Ketones, ur: NEGATIVE mg/dL
Leukocytes, UA: NEGATIVE
Nitrite: NEGATIVE
PROTEIN: NEGATIVE mg/dL
Specific Gravity, Urine: 1.006 (ref 1.005–1.030)
pH: 6.5 (ref 5.0–8.0)

## 2015-08-17 MED ORDER — CYCLOBENZAPRINE HCL 10 MG PO TABS: 10.0000 mg | ORAL_TABLET | Freq: Two times a day (BID) | ORAL | Status: DC | PRN

## 2015-08-17 MED ORDER — NAPROXEN 500 MG PO TABS: 500.0000 mg | ORAL_TABLET | Freq: Two times a day (BID) | ORAL | Status: DC

## 2015-08-17 NOTE — ED Notes (Signed)
Patient transported to CT 
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
## 2015-08-17 NOTE — Discharge Instructions (Signed)
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
## 2015-09-13 ENCOUNTER — Other Ambulatory Visit: Payer: Self-pay | Admitting: Family Medicine

## 2015-09-13 DIAGNOSIS — Z1231 Encounter for screening mammogram for malignant neoplasm of breast: Secondary | ICD-10-CM

## 2015-10-04 ENCOUNTER — Other Ambulatory Visit: Payer: Self-pay | Admitting: Family Medicine

## 2015-10-04 DIAGNOSIS — Z1231 Encounter for screening mammogram for malignant neoplasm of breast: Secondary | ICD-10-CM

## 2015-10-18 ENCOUNTER — Other Ambulatory Visit: Payer: Self-pay | Admitting: Family Medicine

## 2015-10-18 ENCOUNTER — Ambulatory Visit
Admission: RE | Admit: 2015-10-18 | Discharge: 2015-10-18 | Disposition: A | Discharge status: Home / Self Care | Payer: Medicaid Other | Source: Ambulatory Visit | Attending: Family Medicine | Admitting: Family Medicine

## 2015-10-18 DIAGNOSIS — Z1231 Encounter for screening mammogram for malignant neoplasm of breast: Secondary | ICD-10-CM | POA: Diagnosis present

## 2016-12-08 ENCOUNTER — Other Ambulatory Visit: Payer: Self-pay | Admitting: Family Medicine

## 2016-12-08 DIAGNOSIS — Z0000 Encounter for general adult medical examination without abnormal findings: Secondary | ICD-10-CM

## 2016-12-08 DIAGNOSIS — Z79890 Hormone replacement therapy (postmenopausal): Secondary | ICD-10-CM

## 2016-12-15 ENCOUNTER — Telehealth: Payer: Self-pay

## 2016-12-15 NOTE — Telephone Encounter (Signed)
Per Dr. Juanell Fairly patient can be seen here please schedule.

## 2016-12-15 NOTE — Telephone Encounter (Signed)
Has been exposed to mold and thinks it is causing some breathing issues   Before making an appointment patient wants to be sure that the provider would be able to treat her or if she should be calling a different type of specialist

## 2016-12-19 ENCOUNTER — Telehealth: Payer: Self-pay | Admitting: Internal Medicine

## 2016-12-19 ENCOUNTER — Encounter: Payer: Self-pay | Admitting: Internal Medicine

## 2016-12-19 ENCOUNTER — Ambulatory Visit (INDEPENDENT_AMBULATORY_CARE_PROVIDER_SITE_OTHER): Payer: Medicaid Other | Admitting: Internal Medicine

## 2016-12-19 VITALS — BP 118/66 | HR 74 | Ht 63.5 in | Wt 118.0 lb

## 2016-12-19 DIAGNOSIS — J3089 Other allergic rhinitis: Secondary | ICD-10-CM

## 2016-12-19 DIAGNOSIS — J4540 Moderate persistent asthma, uncomplicated: Secondary | ICD-10-CM | POA: Diagnosis not present

## 2016-12-19 DIAGNOSIS — K2100 Gastro-esophageal reflux disease with esophagitis, without bleeding: Secondary | ICD-10-CM

## 2016-12-19 MED ORDER — BECLOMETHASONE DIPROPIONATE 80 MCG/ACT IN AERS: 2.0000 | INHALATION_SPRAY | Freq: Two times a day (BID) | RESPIRATORY_TRACT | 12 refills | Status: DC

## 2016-12-19 NOTE — Telephone Encounter (Signed)
Pt calling asking if we can call her back  She was just seen in office and states the Medication list is wrong   She needs to update it   Please call back

## 2016-12-19 NOTE — Progress Notes (Signed)
[facility] Consultation      Assessment and Plan:  61 yo female with allergic asthma symptoms.   Allergic asthma.  --Symptoms of cough, dyspnea, daily.  Second hand smoke and mold exposure in the home could be contributing, not sure that her husband is going to quit, she thinks that there may be mold in home.  --Will start qvar 80 2 puffs twice daily.  --Will also need to address contributing factors below. --Will consider allergy and/or mold exposure if symptoms persist, but would like to see if her symptoms abate with treatment.   Allergic rhinitis.  --May be contributing to asthma.  --Start flonase daily.   [name] --May be contributing to asthma.  --Discussed measures to avoid exacerbating foods and not eating 4 hours before bedtime. She has a follow up with gastroenterology scheduled.       Date: 12/19/2016  MRN# [id number] Carmen Deleon Sep 25, 1955   Carmen Deleon is a 61 y.o. old female seen in consultation for chief complaint of:    Chief Complaint  Patient presents with  . Advice Only    self ref for cough: NP cough but productive cough 1 day w/clear to brown mucus: chest tightness/pressure: SOB w/activity    HPI:   She has been issues with dyspnea and cough. This all started about 3 months ago when it was noted that there was mold in the house and it has appeared to be quite severe. She notices eyes burning, sweating, fatigues, sinus drainage. The cough is present is much of the day. The breathing breathing and cough get better when she goes outside and sits on the porch, worse with going back inside. Her husband smokes about 1ppd or more, she quit smoking about 36 years ago.  She has never been diagnosed with asthma or copd.  She does not take a nasal spray. She has taken an allergy medicine in the past but takes nothing now. She does have reflux and takes omeprazole, and sometimes takes it twice per day about twice per week. Her reflux is  worse with tomato based sauce, peppers, spicy foods and she tries to avoid those things.  She has never been on any inhalers.  No indoor pets.   She has been tested for allergies several years ago she is not sure what she was allergic to.    PMHX:   Past Medical History:  Diagnosis Date  . AC (acromioclavicular) joint bone spurs   . Anxiety   . Arthritis   . Chest pain syndrome   . Decreased ROM of neck   . Depression   . Difficult intubation 2006   with first endoscopy; did ok with endoscopy 10/15  . Dysphagia    cannot swallow large pills, bread etc  . Family history of adverse reaction to anesthesia    mother -PONV  . Fibromyalgia   . Gastroparesis   . GERD (gastroesophageal reflux disease)   . Headache   . Heart murmur    echo 04/26/13: NL LVF, EF 84%, NL wall motion, mild PR, mild-mod TR, mild pulm HTN, mod MR, mild-mod AR (Dr. Neoma Laming)  . Hx-TIA (transient ischemic attack) 2010  . Hydromyelia ([place])    [place]  . Hyperlipidemia   . IBS (irritable bowel syndrome)   . Migraine    ocular   . Mitral valve disorder   . Motion sickness    boats  . Neuromuscular disorder ([place])    no diagnosis  .  Palpitations   . Paresthesia   . Pinched nerve in neck   . PONV (postoperative nausea and vomiting)   . Shortness of breath    exertion   Surgical Hx:  Past Surgical History:  Procedure Laterality Date  . ABDOMINAL HYSTERECTOMY    . ANTERIOR CERVICAL DECOMP/DISCECTOMY FUSION N/A 12/12/2013   Procedure: ANTERIOR CERVICAL DECOMPRESSION/DISCECTOMY FUSION 2 LEVELS;  Surgeon: Kristeen Miss, MD;  Location: [place] [facility];  Service: Neurosurgery;  Laterality: N/A;  C5-6 C6-7 Anterior cervical decompression/diskectomy/fusion  . CARDIOVASCULAR STRESS TEST     05/03/13 ([facility]): probable NL, but can't r/o mild apical ischemia, EF 82%-->Cardiac CTA 05/20/13: Calcium score 8.9, right dominant system, normal coronaries  . COLONOSCOPY WITH  ESOPHAGOGASTRODUODENOSCOPY (EGD)    . ECTOPIC PREGNANCY SURGERY    . SHOULDER ARTHROSCOPY WITH ROTATOR CUFF REPAIR AND SUBACROMIAL DECOMPRESSION Left 10/16/2014   Procedure: SHOULDER ARTHROSCOPY WITH ROTATOR CUFF REPAIR AND SUBACROMIAL DECOMPRESSION;  Surgeon: Leanor Kail, MD;  Location: [facility];  Service: Orthopedics;  Laterality: Left;   Family Hx:  Family History  Problem Relation Age of Onset  . Stroke Mother   . Heart disease Mother   . Cancer Mother   . Hypertension Mother   . Arthritis Mother   . Depression Mother   . Cancer Father   . Arthritis Father   . Depression Father   . Breast cancer Paternal Aunt    Social Hx:   Social History  Substance Use Topics  . Smoking status: Former Smoker    Quit date: 03/03/1976  . Smokeless tobacco: Never Used  . Alcohol use No   Medication:    Current Outpatient Prescriptions:  .  acyclovir (ZOVIRAX) 400 MG tablet, Take 400 mg by mouth daily. At Lunch, Disp: , Rfl:  .  aspirin EC 81 MG tablet, Take 81 mg by mouth daily., Disp: , Rfl:  .  baclofen (LIORESAL) 10 MG tablet, Take 10 mg by mouth 2 (two) times daily. , Disp: , Rfl:  .  Calcium Carbonate-Vit D-Min (CALTRATE 600+D PLUS MINERALS) 600-800 MG-UNIT CHEW, Chew 2 each by mouth every evening., Disp: , Rfl:  .  diazepam (VALIUM) 2 MG tablet, Take 2 mg by mouth every 6 (six) hours. , Disp: , Rfl:  .  diclofenac sodium (VOLTAREN) 1 % GEL, Apply 2 g topically 4 (four) times daily. (Patient taking differently: Apply 2 g topically 4 (four) times daily as needed (for pain). ), Disp: 1 Tube, Rfl: 3 .  esterified estrogens (MENEST) 0.625 MG tablet, Take 0.975 mg by mouth at bedtime. , Disp: , Rfl:  .  magnesium hydroxide (MILK OF MAGNESIA) 400 MG/5ML suspension, Take 30 mLs by mouth daily as needed for mild constipation or moderate constipation. , Disp: , Rfl:  .  omeprazole (PRILOSEC) 40 MG capsule, Take 40 mg by mouth See admin instructions. Takes 1 cap daily at lunch, then takes 1 cap  as needed for acid reflux, Disp: , Rfl:  .  ondansetron (ZOFRAN) 4 MG tablet, Take 4 mg by mouth every 8 (eight) hours as needed for nausea or vomiting., Disp: , Rfl:  .  pregabalin (LYRICA) 75 MG capsule, Take 1 capsule (75 mg total) by mouth 3 (three) times daily. (Patient taking differently: Take 50 mg by mouth 3 (three) times daily. ), Disp: 90 capsule, Rfl: 1 .  promethazine (PHENERGAN) 12.5 MG suppository, Place 12.5 mg rectally every 6 (six) hours as needed for nausea or vomiting., Disp: , Rfl:  .  rosuvastatin (CRESTOR)  5 MG tablet, Take 5 mg by mouth daily., Disp: , Rfl:  .  triamcinolone (KENALOG) 0.025 % cream, Apply 1 application topically daily as needed (for rash). , Disp: , Rfl:    Allergies:  Dilaudid [hydromorphone hcl]; Codeine; Darvon [propoxyphene]; Toradol [ketorolac tromethamine]; Tramadol; Erythromycin; and Other  Review of Systems: Gen:  Denies  fever, sweats, chills HEENT: Denies blurred vision, double vision. bleeds, sore throat Cvc:  No dizziness, chest pain. Resp:   Denies cough or sputum production, shortness of breath Gi: Denies swallowing difficulty, stomach pain. Gu:  Denies bladder incontinence, burning urine Ext:   No Joint pain, stiffness. Skin: No skin rash,  hives  Endoc:  No polyuria, polydipsia. Psych: No depression, insomnia. Other:  All other systems were reviewed with the patient and were negative other that what is mentioned in the HPI.   Physical Examination:   VS: BP 118/66 (BP Location: Left Arm, Cuff Size: Normal)   Pulse 74   Ht 5' 3.5" (1.613 m)   Wt 118 lb (53.5 kg)   SpO2 99%   BMI 20.57 kg/m   General Appearance: No distress  Neuro:without focal findings,  speech normal,  HEENT: PERRLA, EOM intact.   Pulmonary: normal breath sounds, No wheezing.  CardiovascularNormal S1,S2.  No m/r/g.   Abdomen: Benign, Soft, non-tender. Renal:  No costovertebral tenderness  GU:  No performed at this time. Endoc: No evident thyromegaly, no  signs of acromegaly. Skin:   warm, no rashes, no ecchymosis  Extremities: normal, no cyanosis, clubbing.  Other findings:    LABORATORY PANEL:   CBC No results for input(s): WBC, HGB, HCT, PLT in the last 168 hours. ------------------------------------------------------------------------------------------------------------------  Chemistries  No results for input(s): NA, K, CL, CO2, GLUCOSE, BUN, CREATININE, CALCIUM, MG, AST, ALT, ALKPHOS, BILITOT in the last 168 hours.  Invalid input(s): GFRCGP ------------------------------------------------------------------------------------------------------------------  Cardiac Enzymes No results for input(s): TROPONINI in the last 168 hours. ------------------------------------------------------------  RADIOLOGY:  No results found.     Thank  you for the consultation and for allowing [facility] to assist in the care of your patient. Our recommendations are noted above.  Please contact us if we can be of further service.   Marda Stalker, MD.  Board Certified in Internal Medicine, Pulmonary Medicine, [place], and Sleep Medicine.  [facility] Office Number: [phone]  Patricia Pesa, M.D.  Merton Border, M.D  12/19/2016

## 2016-12-19 NOTE — Telephone Encounter (Signed)
Spoke with pt and informed her what is listed in the chart. She asked that we change them when she comes back in. Informed pt we would do that. Nothing further needed.

## 2016-12-19 NOTE — Patient Instructions (Addendum)
--  Start flonase over the counter, use 1 spray in each nostril twice daily.   --Avoid things that make your reflux worse. Do not eat 4 hours before bedtime, avoid drinking 3 hours before bedtime.   --Start over the counter antihistamine any generic form such as fexofenadine, cetirizine.   --Will start qvar 80 2 puffs twice daily and rinse mouth after use (given sample of qvar 40 today).

## 2017-07-07 ENCOUNTER — Other Ambulatory Visit: Payer: Self-pay | Admitting: Neurological Surgery

## 2017-07-07 DIAGNOSIS — M5416 Radiculopathy, lumbar region: Secondary | ICD-10-CM

## 2017-07-09 ENCOUNTER — Ambulatory Visit
Admission: RE | Admit: 2017-07-09 | Discharge: 2017-07-09 | Disposition: A | Discharge status: Home / Self Care | Payer: Medicaid Other | Source: Ambulatory Visit | Attending: Family Medicine | Admitting: Family Medicine

## 2017-07-09 DIAGNOSIS — Z0000 Encounter for general adult medical examination without abnormal findings: Secondary | ICD-10-CM

## 2017-07-09 DIAGNOSIS — Z1231 Encounter for screening mammogram for malignant neoplasm of breast: Secondary | ICD-10-CM | POA: Diagnosis not present

## 2017-07-09 DIAGNOSIS — Z79890 Hormone replacement therapy (postmenopausal): Secondary | ICD-10-CM

## 2017-07-10 ENCOUNTER — Ambulatory Visit
Admission: RE | Admit: 2017-07-10 | Discharge: 2017-07-10 | Disposition: A | Discharge status: Home / Self Care | Payer: Medicaid Other | Source: Ambulatory Visit | Attending: Neurological Surgery | Admitting: Neurological Surgery

## 2017-07-10 DIAGNOSIS — M5416 Radiculopathy, lumbar region: Secondary | ICD-10-CM

## 2017-08-15 ENCOUNTER — Other Ambulatory Visit: Payer: Self-pay | Admitting: Neurological Surgery

## 2017-08-15 DIAGNOSIS — G950 Syringomyelia and syringobulbia: Secondary | ICD-10-CM

## 2017-09-02 ENCOUNTER — Inpatient Hospital Stay
Admission: RE | Admit: 2017-09-02 | Discharge: 2017-09-02 | Disposition: A | Discharge status: Home / Self Care | Payer: Medicaid Other | Source: Ambulatory Visit | Attending: Neurological Surgery | Admitting: Neurological Surgery

## 2017-09-08 ENCOUNTER — Inpatient Hospital Stay
Admission: RE | Admit: 2017-09-08 | Discharge: 2017-09-08 | Disposition: A | Discharge status: Home / Self Care | Payer: Medicaid Other | Source: Ambulatory Visit | Attending: Neurological Surgery | Admitting: Neurological Surgery

## 2017-11-08 ENCOUNTER — Ambulatory Visit
Admission: RE | Admit: 2017-11-08 | Discharge: 2017-11-08 | Disposition: A | Discharge status: Home / Self Care | Payer: Medicaid Other | Source: Ambulatory Visit | Attending: Neurological Surgery | Admitting: Neurological Surgery

## 2017-11-08 DIAGNOSIS — G950 Syringomyelia and syringobulbia: Secondary | ICD-10-CM

## 2018-01-04 ENCOUNTER — Other Ambulatory Visit: Payer: Self-pay | Admitting: Family Medicine

## 2018-01-04 DIAGNOSIS — R590 Localized enlarged lymph nodes: Secondary | ICD-10-CM

## 2018-01-11 ENCOUNTER — Ambulatory Visit
Admission: RE | Admit: 2018-01-11 | Discharge: 2018-01-11 | Disposition: A | Discharge status: Home / Self Care | Payer: Medicaid Other | Source: Ambulatory Visit | Attending: Family Medicine | Admitting: Family Medicine

## 2018-01-11 DIAGNOSIS — R590 Localized enlarged lymph nodes: Secondary | ICD-10-CM | POA: Insufficient documentation

## 2018-01-11 DIAGNOSIS — E042 Nontoxic multinodular goiter: Secondary | ICD-10-CM | POA: Diagnosis not present

## 2018-03-12 ENCOUNTER — Encounter

## 2018-03-12 ENCOUNTER — Ambulatory Visit (INDEPENDENT_AMBULATORY_CARE_PROVIDER_SITE_OTHER): Payer: Medicaid Other | Admitting: Cardiovascular Disease

## 2018-03-12 ENCOUNTER — Encounter: Payer: Self-pay | Admitting: Cardiovascular Disease

## 2018-03-12 VITALS — BP 128/66 | HR 77 | Ht 63.0 in | Wt 124.0 lb

## 2018-03-12 DIAGNOSIS — R002 Palpitations: Secondary | ICD-10-CM

## 2018-03-12 DIAGNOSIS — R0602 Shortness of breath: Secondary | ICD-10-CM

## 2018-03-12 DIAGNOSIS — Z01810 Encounter for preprocedural cardiovascular examination: Secondary | ICD-10-CM | POA: Diagnosis not present

## 2018-03-12 NOTE — Patient Instructions (Signed)
Medication Instructions:  No changes If you need a refill on your cardiac medications before your next appointment, please call your pharmacy.   Lab work: None ordered  Testing/Procedures: Your physician has requested that you have an echocardiogram. Echocardiography is a painless test that uses sound waves to create images of your heart. It provides your doctor with information about the size and shape of your heart and how well your heart's chambers and valves are working. You may receive an ultrasound enhancing agent through an IV if needed to better visualize your heart during the echo.This procedure takes approximately one hour. There are no restrictions for this procedure. This will take place at the [facility] clinic.    Follow-Up: Dr. Fletcher Anon would like for you to follow up as needed

## 2018-03-12 NOTE — Progress Notes (Signed)
Cardiology Office Note   Date:  03/12/2018   ID:  Carmen Deleon, DOB [birth year], MRN [id number]  PCP:  Carmen Lank, MD  Cardiologist:   Carmen Sacramento, MD   Chief Complaint  Patient presents with  . New Patient (Initial Visit)    Palpatations/ dental clearance.Previous patient of Dr. Humphrey Deleon. Medications reviewed verbally.      History of Present Illness: Carmen Deleon is a 63 y.o. female who presents for evaluation of palpitations and shortness of breath.  The patient has history of chronic chest pain thought to be due to costochondritis.  She has history of previous tobacco use and hyperlipidemia.  She is exposed to secondhand smoking.  She had cardiac evaluation done by Dr. Humphrey Deleon in 2015 which included CTA of the coronary arteries.  It showed a calcium score of 9 with normal coronary arteries.  Echocardiogram at that time showed an EF greater than 65% with mild to moderate tricuspid regurgitation, mild pulmonary hypertension and moderate mitral regurgitation.  The patient reports exertional dyspnea with no orthopnea, PND or leg edema.  She reports stable chest pain with no recent worsening.  She did have palpitations in the past but her symptoms currently are very mild.  No previous syncope.  No prolonged tachycardia.    Past Medical History:  Diagnosis Date  . AC (acromioclavicular) joint bone spurs   . Anxiety   . Arthritis   . Chest pain syndrome   . Decreased ROM of neck   . Depression   . Difficult intubation 2006   with first endoscopy; did ok with endoscopy 10/15  . Dysphagia    cannot swallow large pills, bread etc  . Family history of adverse reaction to anesthesia    mother -PONV  . Fibromyalgia   . Gastroparesis   . GERD (gastroesophageal reflux disease)   . Headache   . Heart murmur    echo 04/26/13: NL LVF, EF 84%, NL wall motion, mild PR, mild-mod TR, mild pulm HTN, mod MR, mild-mod AR (Dr. Neoma Laming)  . Hx-TIA (transient ischemic attack) 2010  .  Hydromyelia (Carmen Deleon)    [place]  . Hyperlipidemia   . IBS (irritable bowel syndrome)   . Migraine    ocular   . Mitral valve disorder   . Motion sickness    boats  . Neuromuscular disorder ([place])    no diagnosis  . Palpitations   . Paresthesia   . Pinched nerve in neck   . PONV (postoperative nausea and vomiting)   . Shortness of breath    exertion    Past Surgical History:  Procedure Laterality Date  . ABDOMINAL HYSTERECTOMY    . ANTERIOR CERVICAL DECOMP/DISCECTOMY FUSION N/A 12/12/2013   Procedure: ANTERIOR CERVICAL DECOMPRESSION/DISCECTOMY FUSION 2 LEVELS;  Surgeon: Carmen Miss, MD;  Location: [place] [facility];  Service: Neurosurgery;  Laterality: N/A;  C5-6 C6-7 Anterior cervical decompression/diskectomy/fusion  . CARDIOVASCULAR STRESS TEST     05/03/13 ([facility]): probable NL, but can't r/o mild apical ischemia, EF 82%-->Cardiac CTA 05/20/13: Calcium score 8.9, right dominant system, normal coronaries  . COLONOSCOPY WITH ESOPHAGOGASTRODUODENOSCOPY (EGD)    . ECTOPIC PREGNANCY SURGERY    . SHOULDER ARTHROSCOPY WITH ROTATOR CUFF REPAIR AND SUBACROMIAL DECOMPRESSION Left 10/16/2014   Procedure: SHOULDER ARTHROSCOPY WITH ROTATOR CUFF REPAIR AND SUBACROMIAL DECOMPRESSION;  Surgeon: Carmen Kail, MD;  Location: [facility];  Service: Orthopedics;  Laterality: Left;     Current Outpatient Medications  Medication Sig Dispense Refill  .  acyclovir (ZOVIRAX) 400 MG tablet Take 400 mg by mouth daily. At [facility]    . aspirin EC 81 MG tablet Take 81 mg by mouth daily.    . baclofen (LIORESAL) 10 MG tablet Take 10 mg by mouth 2 (two) times daily.     . Calcium Carbonate-Vit D-Min (CALTRATE 600+D PLUS MINERALS) 600-800 MG-UNIT CHEW Chew 2 each by mouth every evening.    . diazepam (VALIUM) 2 MG tablet Take 2 mg by mouth every 6 (six) hours.     Marland Kitchen esterified estrogens (MENEST) 0.625 MG tablet Take 0.975 mg by mouth at bedtime.     . magnesium hydroxide (MILK OF MAGNESIA) 400  MG/5ML suspension Take 30 mLs by mouth daily as needed for mild constipation or moderate constipation.     Marland Kitchen omeprazole (PRILOSEC) 40 MG capsule Take 40 mg by mouth See admin instructions. Takes 1 cap daily at lunch, then takes 1 cap as needed for acid reflux    . ondansetron (ZOFRAN) 4 MG tablet Take 4 mg by mouth every 8 (eight) hours as needed for nausea or vomiting.    . pregabalin (LYRICA) 75 MG capsule Take 1 capsule (75 mg total) by mouth 3 (three) times daily. (Patient taking differently: Take 50 mg by mouth 3 (three) times daily. ) 90 capsule 1  . promethazine (PHENERGAN) 12.5 MG suppository Place 12.5 mg rectally every 6 (six) hours as needed for nausea or vomiting.    . rosuvastatin (CRESTOR) 5 MG tablet Take 5 mg by mouth daily.    Marland Kitchen triamcinolone (KENALOG) 0.025 % cream Apply 1 application topically daily as needed (for rash).      No current facility-administered medications for this visit.     Allergies:   Dilaudid [hydromorphone hcl]; Codeine; Darvon [propoxyphene]; Toradol [ketorolac tromethamine]; Tramadol; Erythromycin; and Other    Social History:  The patient  reports that she quit smoking about 42 years ago. She has never used smokeless tobacco. She reports that she does not drink alcohol or use drugs.   Family History:  The patient's family history includes Arthritis in her father and mother; Breast cancer in her paternal aunt; Cancer in her father and mother; Depression in her father and mother; Heart disease in her mother; Hypertension in her mother; Stroke in her mother.    ROS:  Please see the history of present illness.   Otherwise, review of systems are positive for none.   All other systems are reviewed and negative.    PHYSICAL EXAM: VS:  BP 128/66 (BP Location: Right Arm, Patient Position: Sitting, Cuff Size: Normal)   Pulse 77   Ht 5\' 3"  (1.6 m)   Wt 124 lb (56.2 kg)   BMI 21.97 kg/m  , BMI Body mass index is 21.97 kg/m. GEN: Well nourished, well  developed, in no acute distress  HEENT: normal  Neck: no JVD, carotid bruits, or masses Cardiac: RRR; no murmurs, rubs, or gallops,no edema  Respiratory:  clear to auscultation bilaterally, normal work of breathing GI: soft, nontender, nondistended, + BS MS: no deformity or atrophy  Skin: warm and dry, no rash Neuro:  Strength and sensation are intact Psych: euthymic mood, full affect  Distal pulse are palpable.s  EKG:  EKG is ordered today. The ekg ordered today demonstrates normal sinus rhythm with possible left atrial enlargement and nonspecific ST changes.   Recent Labs: No results found for requested labs within last 8760 hours.    Lipid Panel No results found for: CHOL, TRIG,  HDL, CHOLHDL, VLDL, LDLCALC, LDLDIRECT    Wt Readings from Last 3 Encounters:  03/12/18 124 lb (56.2 kg)  12/19/16 118 lb (53.5 kg)  10/16/14 131 lb (59.4 kg)       PAD Screen 03/12/2018  Previous PAD dx? No  Previous surgical procedure? No  Pain with walking? Yes  Subsides with rest? Yes  Feet/toe relief with dangling? No  Painful, non-healing ulcers? No  Extremities discolored? No      ASSESSMENT AND PLAN:  1.  Exertional dyspnea with chronic chest pain: Her chest pain is thought to be due to costochondritis and seems to be very atypical overall.  She did have previous CTA of the coronary arteries in 2015 which showed normal coronary arteries.  I do not think she requires further ischemic cardiac evaluation given that the chance of ischemic heart disease is extremely low. In terms of her shortness of breath, previous echocardiogram did show moderate mitral regurgitation and mild pulmonary hypertension.  Thus, I think it is important to follow-up on these abnormal findings.  I requested an echocardiogram.  2.  Preop cardiovascular evaluation before dental surgery: The patient should be considered at an overall low risk for cardiovascular complications with no need for further ischemic  cardiac work-up.  3.  Palpitations: Seems to be very mild and thus there is very little utility for outpatient monitoring at the present time.    Disposition:   FU with me as needed  Signed,  Carmen Sacramento, MD  03/12/2018 2:49 PM    [place]

## 2018-03-16 ENCOUNTER — Ambulatory Visit: Payer: Medicaid Other | Admitting: Cardiovascular Disease

## 2018-04-09 ENCOUNTER — Other Ambulatory Visit: Payer: Medicaid Other

## 2018-05-07 ENCOUNTER — Ambulatory Visit (INDEPENDENT_AMBULATORY_CARE_PROVIDER_SITE_OTHER): Payer: Medicaid Other

## 2018-05-07 ENCOUNTER — Telehealth: Payer: Self-pay | Admitting: *Deleted

## 2018-05-07 DIAGNOSIS — R0602 Shortness of breath: Secondary | ICD-10-CM | POA: Diagnosis not present

## 2018-05-07 NOTE — Telephone Encounter (Signed)
Left a message for the patient to call back.  

## 2018-05-07 NOTE — Telephone Encounter (Signed)
-----   Message from Wellington Hampshire, MD sent at 05/07/2018  5:36 PM EST ----- Inform patient that echo was fine.  Normal ejection fraction with mild to moderate aortic regurgitation.  Nothing significant overall.

## 2018-05-10 NOTE — Telephone Encounter (Signed)
Patient returning call for results  States Dr Rockey Situ called on Friday

## 2018-05-10 NOTE — Telephone Encounter (Signed)
Results called to pt. Pt verbalized understanding.  

## 2018-05-11 NOTE — Telephone Encounter (Signed)
Most recent echo was reassuring . There was mild-moderate aortic insufficieny. A repeat echo in 3 years is reasonable.

## 2018-05-11 NOTE — Telephone Encounter (Signed)
Patient notified and verbalized understanding. 

## 2018-05-11 NOTE — Telephone Encounter (Signed)
Patient would like to clarify how often she would need to have another echo. She is to follow up with Dr Fletcher Anon as needed. In the past, she's had it checked every year.   Advised I will ask Dr Fletcher Anon and let her know.

## 2018-05-11 NOTE — Telephone Encounter (Signed)
Patient states she doesn't understand results and has additional questions.  Please call to discuss further .

## 2018-08-16 ENCOUNTER — Other Ambulatory Visit: Payer: Self-pay | Admitting: Family Medicine

## 2018-08-16 DIAGNOSIS — Z1231 Encounter for screening mammogram for malignant neoplasm of breast: Secondary | ICD-10-CM

## 2018-09-21 ENCOUNTER — Ambulatory Visit
Admission: RE | Admit: 2018-09-21 | Discharge: 2018-09-21 | Disposition: A | Discharge status: Home / Self Care | Payer: Medicaid Other | Source: Ambulatory Visit | Attending: Family Medicine | Admitting: Family Medicine

## 2018-09-21 DIAGNOSIS — Z1231 Encounter for screening mammogram for malignant neoplasm of breast: Secondary | ICD-10-CM | POA: Insufficient documentation

## 2018-09-29 ENCOUNTER — Other Ambulatory Visit: Payer: Self-pay | Admitting: Family Medicine

## 2018-09-29 DIAGNOSIS — N6489 Other specified disorders of breast: Secondary | ICD-10-CM

## 2018-09-29 DIAGNOSIS — R928 Other abnormal and inconclusive findings on diagnostic imaging of breast: Secondary | ICD-10-CM

## 2018-10-01 ENCOUNTER — Ambulatory Visit
Admission: RE | Admit: 2018-10-01 | Discharge: 2018-10-01 | Disposition: A | Discharge status: Home / Self Care | Payer: Medicaid Other | Source: Ambulatory Visit | Attending: Family Medicine | Admitting: Family Medicine

## 2018-10-01 ENCOUNTER — Other Ambulatory Visit: Payer: Self-pay

## 2018-10-01 DIAGNOSIS — R928 Other abnormal and inconclusive findings on diagnostic imaging of breast: Secondary | ICD-10-CM

## 2018-10-01 DIAGNOSIS — N6489 Other specified disorders of breast: Secondary | ICD-10-CM | POA: Diagnosis present

## 2018-11-18 ENCOUNTER — Emergency Department (HOSPITAL_COMMUNITY)
Admission: EM | Admit: 2018-11-18 | Discharge: 2018-11-18 | Disposition: A | Discharge status: Home / Self Care | Payer: Medicaid Other | Attending: Emergency Medicine | Admitting: Emergency Medicine

## 2018-11-18 ENCOUNTER — Encounter (HOSPITAL_COMMUNITY): Payer: Self-pay | Admitting: Emergency Medicine

## 2018-11-18 DIAGNOSIS — Z7982 Long term (current) use of aspirin: Secondary | ICD-10-CM | POA: Insufficient documentation

## 2018-11-18 DIAGNOSIS — R064 Hyperventilation: Secondary | ICD-10-CM | POA: Diagnosis not present

## 2018-11-18 DIAGNOSIS — Z87891 Personal history of nicotine dependence: Secondary | ICD-10-CM | POA: Diagnosis not present

## 2018-11-18 DIAGNOSIS — F410 Panic disorder [episodic paroxysmal anxiety] without agoraphobia: Secondary | ICD-10-CM | POA: Diagnosis present

## 2018-11-18 DIAGNOSIS — Z79899 Other long term (current) drug therapy: Secondary | ICD-10-CM | POA: Insufficient documentation

## 2018-11-18 NOTE — ED Provider Notes (Signed)
[facility] EMERGENCY DEPARTMENT Provider Note   CSN: [id number] Arrival date & time: 11/18/18  2115     History   Chief Complaint Chief Complaint  Patient presents with  . Panic Attack    HPI Carmen Deleon is a 63 y.o. female with past medical history significant for anxiety, arthritis dysphasia, fibromyalgia, GERD, headache, palpitations who presents for evaluation of anxiety.  Patient states her husband had a syncopal event she states she "started to panic and breathe heavily."  When EMS arrived patient was hyperventilating.  He took her to the emergency department for evaluation.  Patient denies headache, dizziness, lightness, chest pain, shortness of breath, abdominal pain, diarrhea, dysuria, paresthesias, new lateral weakness.  Patient states this felt similar to her prior history of anxiety attacks.  Denies additional aggravating or alleviating factors.  Patient states she has no symptoms on my initial evaluation.  Has previously used Valium for anxiety attacks. Denies SI. HI, AVH.  History obtained from patient and past medical records.  No interpreter is used.     HPI  Past Medical History:  Diagnosis Date  . AC (acromioclavicular) joint bone spurs   . Anxiety   . Arthritis   . Chest pain syndrome   . Decreased ROM of neck   . Depression   . Difficult intubation 2006   with first endoscopy; did ok with endoscopy 10/15  . Dysphagia    cannot swallow large pills, bread etc  . Family history of adverse reaction to anesthesia    mother -PONV  . Fibromyalgia   . Gastroparesis   . GERD (gastroesophageal reflux disease)   . Headache   . Heart murmur    echo 04/26/13: NL LVF, EF 84%, NL wall motion, mild PR, mild-mod TR, mild pulm HTN, mod MR, mild-mod AR (Dr. Neoma Laming)  . Hx-TIA (transient ischemic attack) 2010  . Hydromyelia ([place])    [place]  . Hyperlipidemia   . IBS (irritable bowel syndrome)   . Migraine    ocular   . Mitral valve disorder    . Motion sickness    boats  . Neuromuscular disorder ([place])    no diagnosis  . Palpitations   . Paresthesia   . Pinched nerve in neck   . PONV (postoperative nausea and vomiting)   . Shortness of breath    exertion    Patient Active Problem List   Diagnosis Date Noted  . Postoperative nausea and vomiting 12/13/2013  . Cervical spondylosis with myelopathy and radiculopathy 12/12/2013  . Medial epicondylitis of left elbow 04/20/2012  . Medial epicondylitis of right elbow 04/20/2012  . Fibromyalgia 04/20/2012  . Syrinx of spinal cord ([place]) 04/20/2012  . Neuropathic pain 04/20/2012  . Chronic left hip pain 02/11/2012  . Left medial knee pain 02/11/2012  . Right shoulder pain 10/13/2011  . Cervicalgia 09/26/2011  . Hip bursitis, left 09/26/2011  . Lumbago 09/26/2011    Past Surgical History:  Procedure Laterality Date  . ABDOMINAL HYSTERECTOMY    . ANTERIOR CERVICAL DECOMP/DISCECTOMY FUSION N/A 12/12/2013   Procedure: ANTERIOR CERVICAL DECOMPRESSION/DISCECTOMY FUSION 2 LEVELS;  Surgeon: Kristeen Miss, MD;  Location: [place] [facility];  Service: Neurosurgery;  Laterality: N/A;  C5-6 C6-7 Anterior cervical decompression/diskectomy/fusion  . CARDIOVASCULAR STRESS TEST     05/03/13 ([facility]): probable NL, but can't r/o mild apical ischemia, EF 82%-->Cardiac CTA 05/20/13: Calcium score 8.9, right dominant system, normal coronaries  . COLONOSCOPY WITH ESOPHAGOGASTRODUODENOSCOPY (EGD)    . ECTOPIC PREGNANCY SURGERY    .  SHOULDER ARTHROSCOPY WITH ROTATOR CUFF REPAIR AND SUBACROMIAL DECOMPRESSION Left 10/16/2014   Procedure: SHOULDER ARTHROSCOPY WITH ROTATOR CUFF REPAIR AND SUBACROMIAL DECOMPRESSION;  Surgeon: Leanor Kail, MD;  Location: [facility] ORS;  Service: Orthopedics;  Laterality: Left;     OB History   No obstetric history on file.      Home Medications    Prior to Admission medications   Medication Sig Start Date End Date Taking? Authorizing Provider  acyclovir  (ZOVIRAX) 400 MG tablet Take 400 mg by mouth daily. At [facility] 07/14/11   [provider]  aspirin EC 81 MG tablet Take 81 mg by mouth daily.    [provider]  baclofen (LIORESAL) 10 MG tablet Take 10 mg by mouth 2 (two) times daily.     [provider]  Calcium Carbonate-Vit D-Min (CALTRATE 600+D PLUS MINERALS) 600-800 MG-UNIT CHEW Chew 2 each by mouth every evening.    [provider]  diazepam (VALIUM) 2 MG tablet Take 2 mg by mouth every 6 (six) hours.  07/14/11   [provider]  esterified estrogens (MENEST) 0.625 MG tablet Take 0.975 mg by mouth at bedtime.     [provider]  magnesium hydroxide (MILK OF MAGNESIA) 400 MG/5ML suspension Take 30 mLs by mouth daily as needed for mild constipation or moderate constipation.     [provider]  omeprazole (PRILOSEC) 40 MG capsule Take 40 mg by mouth See admin instructions. Takes 1 cap daily at lunch, then takes 1 cap as needed for acid reflux 07/15/11   [provider]  ondansetron (ZOFRAN) 4 MG tablet Take 4 mg by mouth every 8 (eight) hours as needed for nausea or vomiting.    [provider]  pregabalin (LYRICA) 75 MG capsule Take 1 capsule (75 mg total) by mouth 3 (three) times daily. Patient taking differently: Take 50 mg by mouth 3 (three) times daily.  06/19/14   Kirsteins, Luanna Salk, MD  promethazine (PHENERGAN) 12.5 MG suppository Place 12.5 mg rectally every 6 (six) hours as needed for nausea or vomiting.    [provider]  rosuvastatin (CRESTOR) 5 MG tablet Take 5 mg by mouth daily.    [provider]  triamcinolone (KENALOG) 0.025 % cream Apply 1 application topically daily as needed (for rash).     [provider]    Family History Family History  Problem Relation Age of Onset  . Stroke Mother   . Heart disease Mother   . Cancer Mother        lung  . Hypertension Mother   . Arthritis Mother   . Depression Mother   .  Cancer Father   . Arthritis Father   . Depression Father   . Breast cancer Paternal Aunt     Social History Social History   Tobacco Use  . Smoking status: Former Smoker    Quit date: 03/03/1976    Years since quitting: 42.7  . Smokeless tobacco: Never Used  Substance Use Topics  . Alcohol use: No  . Drug use: No     Allergies   Dilaudid [hydromorphone hcl], Codeine, Darvon [propoxyphene], Toradol [ketorolac tromethamine], Tramadol, Erythromycin, and Other   Review of Systems Review of Systems  Constitutional: Negative.   HENT: Negative.   Respiratory: Negative.   Cardiovascular: Negative.   Gastrointestinal: Negative.   Genitourinary: Negative.   Musculoskeletal: Negative.   Skin: Negative.   Psychiatric/Behavioral: The patient is nervous/anxious.   All other systems reviewed and are negative.  Physical Exam Updated Vital Signs BP 112/67   Pulse 76   Temp 98.6 F (37 C)   Resp 14   Ht 5\' 3"  (1.6 m)   Wt 57.2 kg   SpO2 100%   BMI 22.32 kg/m   Physical Exam Vitals signs and nursing note reviewed.  Constitutional:      General: She is not in acute distress.    Appearance: She is well-developed. She is not ill-appearing, toxic-appearing or diaphoretic.     Comments: Talking on the phone on initial evaluation and drinking water.  No acute distress noted.  HENT:     Head: Normocephalic and atraumatic.     Nose: Nose normal.     Mouth/Throat:     Mouth: Mucous membranes are moist.  Eyes:     Pupils: Pupils are equal, round, and reactive to light.  Neck:     Musculoskeletal: Normal range of motion and neck supple.     Trachea: Phonation normal.     Comments: Full active and passive ROM without pain No midline or paraspinal tenderness No nuchal rigidity or meningeal signs  Cardiovascular:     Rate and Rhythm: Normal rate.     Pulses: Normal pulses.     Heart sounds: Normal heart sounds. No murmur. No friction rub. No gallop.   Pulmonary:     Effort:  Pulmonary effort is normal. No respiratory distress.     Breath sounds: Normal breath sounds.     Comments: Clear to auscultation bilateral wheeze, rhonchi or rales.  No accessory muscle usage.  Speaks in full sentences without difficulty. Abdominal:     General: There is no distension.     Comments: Soft, nontender without rebound or guarding.  Musculoskeletal: Normal range of motion.     Comments: Moves all 4 extremities without difficulty.  Homans sign negative bilaterally.  No deformity or injury.  Skin:    General: Skin is warm and dry.     Comments: Brisk capillary refill.  No edema, erythema, ecchymosis or warmth.  No rashes or lesions.  Neurological:     Mental Status: She is alert.     Comments: Mental Status:  Alert, oriented, thought content appropriate. Speech fluent without evidence of aphasia. Able to follow 2 step commands without difficulty.  Cranial Nerves:  II:  Peripheral visual fields grossly normal, pupils equal, round, reactive to light III,IV, VI: ptosis not present, extra-ocular motions intact bilaterally  V,VII: smile symmetric, facial light touch sensation equal VIII: hearing grossly normal bilaterally  IX,X: midline uvula rise  XI: bilateral shoulder shrug equal and strong XII: midline tongue extension  Motor:  5/5 in upper and lower extremities bilaterally including strong and equal grip strength and dorsiflexion/plantar flexion Sensory: Pinprick and light touch normal in all extremities.  Deep Tendon Reflexes: 2+ and symmetric  Cerebellar: normal finger-to-nose with bilateral upper extremities Gait: normal gait and balance CV: distal pulses palpable throughout      ED Treatments / Results  Labs (all labs ordered are listed, but only abnormal results are displayed) Labs Reviewed - No data to display  EKG EKG Interpretation  Date/Time:  Thursday November 18 2018 22:18:30 EDT Ventricular Rate:  83 PR Interval:    QRS Duration: 92 QT Interval:   387 QTC Calculation: 455 R Axis:   45 Text Interpretation:  Sinus rhythm Consider left atrial enlargement Confirmed by Davonna Belling [phone]) on 11/18/2018 10:53:52 PM   Radiology No results found.  Procedures Procedures (including critical care time)  Medications Ordered in ED Medications - No data to display   Initial Impression / Assessment and Plan / ED Course  I have reviewed the triage vital signs and the nursing notes.  Pertinent labs & imaging results that were available during my care of the patient were reviewed by me and considered in my medical decision making (see chart for details).  63 year old female appears otherwise well presents for evaluation of anxiety.  She is afebrile, nonseptic, non-ill-appearing.  History of anxiety and states this feels similar.  Has been had syncopal episode at home when she started hyperventilating.  When EMS arrived for her husband they offered to take her to the emergency department for evaluation.  On my initial evaluation patient has no symptoms.  She denied headache, lateral weakness, chest pain, shortness of breath.  Patient agrees to EKG however does not want any additional imaging or laboratory test at this time.  Patient states this feels similar to her prior anxiety attacks.  Discussed risk versus benefit to include missed cardiac, neurologic or pulmonary pathology as cause of her hyperventilation.  Patient voiced understanding and does not want any additional testing at this time.  EKG without ST/T changes.  No STEMI. Denies SI/HI/AVH.   Patient has been DC home on repeat evaluation.  She declined additional testing.  She remains asymptomatic without tachycardia, tachypnea or hypoxia.  Heart and lungs clear.  No evidence of DVT on exam.  Discussed strict return precautions with patient.  Patient voiced understanding and is agreeable follow-up.  The patient has been appropriately medically screened and/or stabilized in the ED. I have  low suspicion for any other emergent medical condition which would require further screening, evaluation or treatment in the ED or require inpatient management.  Patient is hemodynamically stable and in no acute distress.  Patient able to ambulate in department prior to ED.  Evaluation does not show acute pathology that would require ongoing or additional emergent interventions while in the emergency department or further inpatient treatment.  I have discussed the diagnosis with the patient and answered all questions.  Pain is been managed while in the emergency department and patient has no further complaints prior to discharge.  Patient is comfortable with plan discussed in room and is stable for discharge at this time.  I have discussed strict return precautions for returning to the emergency department.  Patient was encouraged to follow-up with PCP/specialist refer to at discharge.      Final Clinical Impressions(s) / ED Diagnoses   Final diagnoses:  Hyperventilation    ED Discharge Orders    None       Erhardt Dada A, PA-C 11/18/18 2310    Davonna Belling, MD 11/18/18 [phone]

## 2018-11-18 NOTE — ED Triage Notes (Signed)
Pt's husband was being evaluated by EMS. Pt started hyperventilating. Pt brought in for evaluation. Pt calm, vitals within normal limits, and ambulating with steady gait.

## 2018-11-18 NOTE — Discharge Instructions (Signed)
Return for new or worsening symptoms

## 2019-09-06 ENCOUNTER — Other Ambulatory Visit: Payer: Self-pay | Admitting: Family Medicine

## 2019-09-06 DIAGNOSIS — Z1231 Encounter for screening mammogram for malignant neoplasm of breast: Secondary | ICD-10-CM

## 2019-09-12 ENCOUNTER — Other Ambulatory Visit: Payer: Self-pay | Admitting: Family Medicine

## 2019-09-14 ENCOUNTER — Other Ambulatory Visit: Payer: Self-pay | Admitting: Family Medicine

## 2019-09-14 DIAGNOSIS — Z1239 Encounter for other screening for malignant neoplasm of breast: Secondary | ICD-10-CM

## 2019-09-19 ENCOUNTER — Ambulatory Visit
Admission: RE | Admit: 2019-09-19 | Discharge: 2019-09-19 | Disposition: A | Discharge status: Home / Self Care | Payer: Medicaid Other | Source: Ambulatory Visit | Attending: Family Medicine | Admitting: Family Medicine

## 2019-09-19 DIAGNOSIS — Z1239 Encounter for other screening for malignant neoplasm of breast: Secondary | ICD-10-CM | POA: Diagnosis present

## 2019-10-04 ENCOUNTER — Other Ambulatory Visit: Payer: Self-pay

## 2020-05-21 ENCOUNTER — Other Ambulatory Visit: Payer: Self-pay

## 2020-05-21 ENCOUNTER — Emergency Department
Admission: EM | Admit: 2020-05-21 | Discharge: 2020-05-21 | Disposition: A | Discharge status: Home / Self Care | Payer: Medicaid Other | Attending: Emergency Medicine | Admitting: Emergency Medicine

## 2020-05-21 ENCOUNTER — Emergency Department: Payer: Medicaid Other

## 2020-05-21 DIAGNOSIS — Z8673 Personal history of transient ischemic attack (TIA), and cerebral infarction without residual deficits: Secondary | ICD-10-CM | POA: Diagnosis not present

## 2020-05-21 DIAGNOSIS — Z87891 Personal history of nicotine dependence: Secondary | ICD-10-CM | POA: Diagnosis not present

## 2020-05-21 DIAGNOSIS — M79605 Pain in left leg: Secondary | ICD-10-CM | POA: Insufficient documentation

## 2020-05-21 DIAGNOSIS — M7989 Other specified soft tissue disorders: Secondary | ICD-10-CM | POA: Insufficient documentation

## 2020-05-21 DIAGNOSIS — M797 Fibromyalgia: Secondary | ICD-10-CM | POA: Insufficient documentation

## 2020-05-21 LAB — CBC
HCT: 40.1 % (ref 36.0–46.0)
Hemoglobin: 13.2 g/dL (ref 12.0–15.0)
MCH: 29.9 pg (ref 26.0–34.0)
MCHC: 32.9 g/dL (ref 30.0–36.0)
MCV: 90.7 fL (ref 80.0–100.0)
Platelets: 209 10*3/uL (ref 150–400)
RBC: 4.42 MIL/uL (ref 3.87–5.11)
RDW: 12.9 % (ref 11.5–15.5)
WBC: 9.2 10*3/uL (ref 4.0–10.5)
nRBC: 0 % (ref 0.0–0.2)

## 2020-05-21 LAB — BASIC METABOLIC PANEL
Anion gap: 11 (ref 5–15)
BUN: 13 mg/dL (ref 8–23)
CO2: 26 mmol/L (ref 22–32)
Calcium: 9.6 mg/dL (ref 8.9–10.3)
Chloride: 101 mmol/L (ref 98–111)
Creatinine, Ser: 0.59 mg/dL (ref 0.44–1.00)
GFR, Estimated: >60 mL/min (ref >60)
Glucose, Bld: 98 mg/dL (ref 70–99)
Potassium: 3.5 mmol/L (ref 3.5–5.1)
Sodium: 138 mmol/L (ref 135–145)

## 2020-05-21 MED ORDER — ACETAMINOPHEN 500 MG PO TABS
1000.0000 mg | ORAL_TABLET | Freq: Once | ORAL | Status: AC
  Administered 2020-05-21: 500 mg via ORAL
  Filled 2020-05-21: qty 2

## 2020-05-21 MED ORDER — DROPERIDOL 2.5 MG/ML IJ SOLN
2.5000 mg | Freq: Once | INTRAMUSCULAR | Status: AC
  Administered 2020-05-21: 2.5 mg via INTRAMUSCULAR
  Filled 2020-05-21: qty 2

## 2020-05-21 MED ORDER — LIDOCAINE HCL (PF) 1 % IJ SOLN
10.0000 mL | Freq: Once | INTRAMUSCULAR | Status: AC
  Administered 2020-05-21: 10 mL
  Filled 2020-05-21: qty 10

## 2020-05-21 NOTE — ED Triage Notes (Addendum)
Pt c/o LLE swelling with pain all the way up to the hip to toe, cool to touch for the past 6 days, pedal pulses strong.

## 2020-05-21 NOTE — Discharge Instructions (Signed)
Continue all of your typical medications for your fibromyalgia.  You can easily add Tylenol for a little bit additional pain relief.  Use Tylenol for pain and fevers.  Up to 1000 mg per dose, up to 4 times per day.  Do not take more than 4000 mg of Tylenol/acetaminophen within 24 hours..  If you develop any fevers with your pain, inability to walk or feel your leg, please return to the ED.  Otherwise, please follow-up with your PCP.

## 2020-05-21 NOTE — ED Provider Notes (Signed)
[facility] Emergency Department Provider Note ____________________________________________   Event Date/Time   First MD Initiated Contact with Patient 05/21/20 1953     (approximate)  I have reviewed the triage vital signs and the nursing notes.  HISTORY  Chief Complaint Leg Swelling   HPI Carmen Deleon is a 65 y.o. femalewho presents to the ED for evaluation of left leg swelling.   Chart review indicates history of anxiety, fibromyalgia.  Patient presents to the ED for evaluation of 6 days of cold sensation, swelling and intermittent pain to her left leg globally.  She reports the sensation is throughout her left leg, from hip to toe, anteriorly and posteriorly.  She reports a cold sensation, isolated to this left leg, constantly for the past 6 days.  Further reports of swelling throughout the leg in a similar timeframe.  Further reports intermittent pain to her left leg, primarily over her left greater trochanter laterally and along her IT band, distal quadricep musculature, and her popliteal fossa.  On the left side.  She has no complaints to other extremities.  She denies any fevers, trauma or injuries, falls or gait changes.  She reports a long history of fibromyalgia on Lyrica without recent medication changes.  Denies any back or lumbar trauma.  Denies any acute or worsening back pain.   Past Medical History:  Diagnosis Date  . AC (acromioclavicular) joint bone spurs   . Anxiety   . Arthritis   . Chest pain syndrome   . Decreased ROM of neck   . Depression   . Difficult intubation 2006   with first endoscopy; did ok with endoscopy 10/15  . Dysphagia    cannot swallow large pills, bread etc  . Family history of adverse reaction to anesthesia    mother -PONV  . Fibromyalgia   . Gastroparesis   . GERD (gastroesophageal reflux disease)   . Headache   . Heart murmur    echo 04/26/13: NL LVF, EF 84%, NL wall motion, mild PR, mild-mod TR, mild  pulm HTN, mod MR, mild-mod AR (Dr. Neoma Laming)  . Hx-TIA (transient ischemic attack) 2010  . Hydromyelia ([place])    [place]  . Hyperlipidemia   . IBS (irritable bowel syndrome)   . Migraine    ocular   . Mitral valve disorder   . Motion sickness    boats  . Neuromuscular disorder ([place])    no diagnosis  . Palpitations   . Paresthesia   . Pinched nerve in neck   . PONV (postoperative nausea and vomiting)   . Shortness of breath    exertion    Patient Active Problem List   Diagnosis Date Noted  . Postoperative nausea and vomiting 12/13/2013  . Cervical spondylosis with myelopathy and radiculopathy 12/12/2013  . Medial epicondylitis of left elbow 04/20/2012  . Medial epicondylitis of right elbow 04/20/2012  . Fibromyalgia 04/20/2012  . Syrinx of spinal cord ([place]) 04/20/2012  . Neuropathic pain 04/20/2012  . Chronic left hip pain 02/11/2012  . Left medial knee pain 02/11/2012  . Right shoulder pain 10/13/2011  . Cervicalgia 09/26/2011  . Hip bursitis, left 09/26/2011  . Lumbago 09/26/2011    Past Surgical History:  Procedure Laterality Date  . ABDOMINAL HYSTERECTOMY    . ANTERIOR CERVICAL DECOMP/DISCECTOMY FUSION N/A 12/12/2013   Procedure: ANTERIOR CERVICAL DECOMPRESSION/DISCECTOMY FUSION 2 LEVELS;  Surgeon: Kristeen Miss, MD;  Location: [place] [facility];  Service: Neurosurgery;  Laterality: N/A;  C5-6 C6-7 Anterior  cervical decompression/diskectomy/fusion  . CARDIOVASCULAR STRESS TEST     05/03/13 ([facility]): probable NL, but can't r/o mild apical ischemia, EF 82%-->Cardiac CTA 05/20/13: Calcium score 8.9, right dominant system, normal coronaries  . COLONOSCOPY WITH ESOPHAGOGASTRODUODENOSCOPY (EGD)    . ECTOPIC PREGNANCY SURGERY    . SHOULDER ARTHROSCOPY WITH ROTATOR CUFF REPAIR AND SUBACROMIAL DECOMPRESSION Left 10/16/2014   Procedure: SHOULDER ARTHROSCOPY WITH ROTATOR CUFF REPAIR AND SUBACROMIAL DECOMPRESSION;  Surgeon: Leanor Kail, MD;  Location:  [facility] ORS;  Service: Orthopedics;  Laterality: Left;    Prior to Admission medications   Medication Sig Start Date End Date Taking? Authorizing Provider  acyclovir (ZOVIRAX) 400 MG tablet Take 400 mg by mouth daily. At [facility] 07/14/11   [provider]  aspirin EC 81 MG tablet Take 81 mg by mouth daily.    [provider]  baclofen (LIORESAL) 10 MG tablet Take 10 mg by mouth 2 (two) times daily.     [provider]  Calcium Carbonate-Vit D-Min (CALTRATE 600+D PLUS MINERALS) 600-800 MG-UNIT CHEW Chew 2 each by mouth every evening.    [provider]  diazepam (VALIUM) 2 MG tablet Take 2 mg by mouth every 6 (six) hours.  07/14/11   [provider]  esterified estrogens (MENEST) 0.625 MG tablet Take 0.975 mg by mouth at bedtime.     [provider]  magnesium hydroxide (MILK OF MAGNESIA) 400 MG/5ML suspension Take 30 mLs by mouth daily as needed for mild constipation or moderate constipation.     [provider]  omeprazole (PRILOSEC) 40 MG capsule Take 40 mg by mouth See admin instructions. Takes 1 cap daily at lunch, then takes 1 cap as needed for acid reflux 07/15/11   [provider]  ondansetron (ZOFRAN) 4 MG tablet Take 4 mg by mouth every 8 (eight) hours as needed for nausea or vomiting.    [provider]  pregabalin (LYRICA) 75 MG capsule Take 1 capsule (75 mg total) by mouth 3 (three) times daily. Patient taking differently: Take 50 mg by mouth 3 (three) times daily.  06/19/14   Kirsteins, Luanna Salk, MD  promethazine (PHENERGAN) 12.5 MG suppository Place 12.5 mg rectally every 6 (six) hours as needed for nausea or vomiting.    [provider]  rosuvastatin (CRESTOR) 5 MG tablet Take 5 mg by mouth daily.    [provider]  triamcinolone (KENALOG) 0.025 % cream Apply 1 application topically daily as needed (for rash).     [provider]    Allergies Dilaudid [hydromorphone hcl], Codeine,  Darvon [propoxyphene], Toradol [ketorolac tromethamine], Tramadol, Erythromycin, and Other  Family History  Problem Relation Age of Onset  . Stroke Mother   . Heart disease Mother   . Cancer Mother        lung  . Hypertension Mother   . Arthritis Mother   . Depression Mother   . Cancer Father   . Arthritis Father   . Depression Father   . Breast cancer Paternal Aunt     Social History Social History   Tobacco Use  . Smoking status: Former Smoker    Quit date: 03/03/1976    Years since quitting: 44.2  . Smokeless tobacco: Never Used  Vaping Use  . Vaping Use: Never used  Substance Use Topics  . Alcohol use: No  . Drug use: No    Review of Systems  Constitutional: No fever/chills Eyes: No visual changes. ENT: No sore throat. Cardiovascular: Denies chest pain. Respiratory:  Denies shortness of breath. Gastrointestinal: No abdominal pain.  No nausea, no vomiting.  No diarrhea.  No constipation. Genitourinary: Negative for dysuria. Musculoskeletal: Negative for back pain. Positive for left leg cold sensation, pain and swelling Skin: Negative for rash. Neurological: Negative for headaches, focal weakness or numbness.  ____________________________________________   PHYSICAL EXAM:  VITAL SIGNS: Vitals:   05/21/20 1655 05/21/20 1953  BP: (!) 168/141 138/70  Pulse: 88 72  Resp: 18 20  Temp: 99.1 F (37.3 C)   SpO2: 96% 99%     Constitutional: Alert and oriented. Well appearing and in no acute distress.  Able to take her pants off independently as I help her change into a gown. Eyes: Conjunctivae are normal. PERRL. EOMI. Head: Atraumatic. Nose: No congestion/rhinnorhea. Mouth/Throat: Mucous membranes are moist.  Oropharynx non-erythematous. Neck: No stridor. No cervical spine tenderness to palpation. Cardiovascular: Normal rate, regular rhythm. Grossly normal heart sounds.  Good peripheral circulation. Respiratory: Normal respiratory effort.  No retractions.  Lungs CTAB. Gastrointestinal: Soft , nondistended, nontender to palpation. No CVA tenderness. Musculoskeletal:   No joint effusions. No signs of acute trauma.  Examination of bilateral legs reveals left leg appears symmetric to the right without laterality.  Bilateral feet feel cold symmetrically, but no temperature asymmetry but I am able to appreciate.  Strong DP and PT pulses on the bilateral feet, symmetric and brisk capillary refill.  Sensation intact throughout, though her left leg seems hyperalgesic throughout, and she is hyperalgesic throughout her entire body to a lesser degree. Most tender over her left-sided tensor fascia lata, distal anterior quadriceps musculature with few inches proximal to her knee.   Neurologic:  Normal speech and language. No gross focal neurologic deficits are appreciated.  Cranial nerves II through XII intact 5/5 strength and sensation in all 4 extremities Skin:  Skin is warm, dry and intact. No rash noted. Psychiatric: Mood and affect are normal. Speech and behavior are normal. ____________________________________________   LABS (all labs ordered are listed, but only abnormal results are displayed)  Labs Reviewed  CBC  BASIC METABOLIC PANEL   ____________________________________________  RADIOLOGY  ED MD interpretation:    Official radiology report(s): US Venous Img Lower Unilateral Left  Result Date: 05/21/2020 CLINICAL DATA:  Pain and swelling EXAM: Left LOWER EXTREMITY VENOUS DOPPLER ULTRASOUND TECHNIQUE: Gray-scale sonography with compression, as well as color and duplex ultrasound, were performed to evaluate the deep venous system(s) from the level of the common femoral vein through the popliteal and proximal calf veins. COMPARISON:  None. FINDINGS: VENOUS Normal compressibility of the common femoral, superficial femoral, and popliteal veins, as well as the visualized calf veins. Visualized portions of profunda femoral vein and great saphenous  vein unremarkable. No filling defects to suggest DVT on grayscale or color Doppler imaging. Doppler waveforms show normal direction of venous flow, normal respiratory plasticity and response to augmentation. Limited views of the contralateral common femoral vein are unremarkable. OTHER None. Limitations: none IMPRESSION: Negative. Electronically Signed   By: Prudencio Pair M.D.   On: 05/21/2020 18:26    ____________________________________________   PROCEDURES and INTERVENTIONS  Procedure(s) performed (including Critical Care):  Procedures   Trigger point injection After discussing risks versus benefits, patient is agreeable to proceed with trigger point injection for treatment of acute muscular spasm. Risks discussed include worsening pain, infection, neurovascular damage and pneumothorax.  Point of maximal tenderness was identified at left-sided tensor fascia lata musculature and distal quadriceps musculature Overlying skin was cleaned with alcohol swabs. 1% lidocaine without  epinephrine was incrementally injected, after confirming negative drawback, into this musculature. Total of 56milliliters injected, split evenly between the 2 injection sites. Well-tolerated without any apparent complications.   Medications  lidocaine (PF) (XYLOCAINE) 1 % injection 10 mL (has no administration in time range)  droperidol (INAPSINE) 2.5 MG/ML injection 2.5 mg (has no administration in time range)  acetaminophen (TYLENOL) tablet 1,000 mg (500 mg Oral Given 05/21/20 2041)    ____________________________________________   MDM / ED COURSE   65 year old woman with history of fibromyalgia presents to the ED with acute cold sensation, pain to her left leg, without evidence of acute pathology beyond her fibromyalgia, and ultimately amenable to outpatient management.  Normal vitals on room air.  Exam is quite reassuring without evidence of neurovascular compromise, trauma or distress.  She is quite  hyperalgesic throughout, and particularly to his left leg.  She is most tender in a couple different locations, primarily over her tensor fascia lata musculature and quadriceps.  Venous ultrasound without evidence of DVT.  Her exam shows no evidence of PAD or asymmetry in perfusion.  No trauma, injury or bony tenderness to necessitate plain film imaging at this time.  No back pain to suggest spinal or lumbar pathology.  Anticipate a degree of fibromyalgia causing her symptoms.  Provided droperidol and trigger point injections to her site of maximal pain with improving symptoms.  I see no barriers to outpatient management at this time.  We discussed return precautions for the ED and patient stable for outpatient management.   Clinical Course as of 05/21/20 2156  Mon May 21, 2020  2114 Trigger point injections performed by me, as above.  Well-tolerated.  5 cc total split between 2 locations [DS]  2154 Reassessed.  Patient reports resolving pain and is thankful for care.  We discussed return precautions for the ED.  Answered questions. [DS]    Clinical Course User Index [DS] Vladimir Crofts, MD    ____________________________________________   FINAL CLINICAL IMPRESSION(S) / ED DIAGNOSES  Final diagnoses:  Left leg pain  Fibromyalgia syndrome     ED Discharge Orders    None       Tanette Chauca Tamala Julian   Note:  This document was prepared using Dragon voice recognition software and may include unintentional dictation errors.   Vladimir Crofts, MD 05/21/20 2158

## 2020-09-04 ENCOUNTER — Other Ambulatory Visit (HOSPITAL_COMMUNITY): Payer: Self-pay | Admitting: Family Medicine

## 2020-09-04 ENCOUNTER — Other Ambulatory Visit: Payer: Self-pay

## 2020-09-04 ENCOUNTER — Ambulatory Visit (HOSPITAL_COMMUNITY)
Admission: RE | Admit: 2020-09-04 | Discharge: 2020-09-04 | Disposition: A | Discharge status: Home / Self Care | Payer: Medicaid Other | Source: Ambulatory Visit | Attending: Family Medicine | Admitting: Family Medicine

## 2020-09-04 DIAGNOSIS — M79605 Pain in left leg: Secondary | ICD-10-CM | POA: Insufficient documentation

## 2020-09-25 ENCOUNTER — Other Ambulatory Visit: Payer: Self-pay | Admitting: Family Medicine

## 2020-09-25 ENCOUNTER — Other Ambulatory Visit (HOSPITAL_COMMUNITY): Payer: Self-pay | Admitting: Family Medicine

## 2020-09-25 DIAGNOSIS — M79605 Pain in left leg: Secondary | ICD-10-CM

## 2020-09-25 DIAGNOSIS — G90513 Complex regional pain syndrome I of upper limb, bilateral: Secondary | ICD-10-CM

## 2020-09-26 ENCOUNTER — Other Ambulatory Visit: Payer: Self-pay | Admitting: Family Medicine

## 2020-09-26 DIAGNOSIS — M79605 Pain in left leg: Secondary | ICD-10-CM

## 2020-09-26 DIAGNOSIS — G90513 Complex regional pain syndrome I of upper limb, bilateral: Secondary | ICD-10-CM

## 2020-10-09 ENCOUNTER — Other Ambulatory Visit: Payer: Self-pay | Admitting: Family Medicine

## 2020-10-09 DIAGNOSIS — M1990 Unspecified osteoarthritis, unspecified site: Secondary | ICD-10-CM

## 2020-10-09 DIAGNOSIS — R202 Paresthesia of skin: Secondary | ICD-10-CM

## 2020-10-09 DIAGNOSIS — M79605 Pain in left leg: Secondary | ICD-10-CM

## 2020-10-19 ENCOUNTER — Other Ambulatory Visit: Payer: Medicaid Other

## 2020-10-21 ENCOUNTER — Other Ambulatory Visit: Payer: Medicaid Other

## 2020-12-13 ENCOUNTER — Telehealth: Payer: Self-pay | Admitting: Cardiovascular Disease

## 2020-12-13 NOTE — Telephone Encounter (Signed)
Patient was last seen by Dr. Fletcher Anon Jan 2020 and was to f/u prn.  Called the patient. Patient sts that she has a lot of health issues and planned call her pcp tomorrow. She is not currently having chest pain at this time. Patient sts that she knows when she needs to call 911 and will do see if she needs to. She declined to schedule an appt with cardiology at this time.  Advised her that starting with her pcp was fine. She should contact our office to schedule an appt if her symptoms are determined to be cardiac related. Patient sts that if  she needs a Cardiologist she would rqst to not see Dr. Fletcher Anon and would prefer to see someone else. Advised her that would be fine she should voice her rqst whenscheduling an appt.  Patient voiced appreciation for the call.

## 2020-12-13 NOTE — Telephone Encounter (Signed)
Pt c/o of Chest Pain: STAT if CP now or developed within 24 hours  1. Are you having CP right now? no  2. Are you experiencing any other symptoms (ex. SOB, nausea, vomiting, sweating)? Unable to discuss  3. How long have you been experiencing CP? Unknown   4. Is your CP continuous or coming and going? Intermittent   5. Have you taken Nitroglycerin? Took asa   Patient calling to get refills for spouse whom is also a patient . She declined to schedule a visit or to have a note taken and have a nurse review cp episodes.  ?

## 2021-02-08 ENCOUNTER — Ambulatory Visit
Admission: RE | Admit: 2021-02-08 | Discharge: 2021-02-08 | Disposition: A | Discharge status: Home / Self Care | Payer: Medicare Other | Source: Ambulatory Visit | Attending: Family Medicine | Admitting: Family Medicine

## 2021-02-08 ENCOUNTER — Ambulatory Visit
Admission: RE | Admit: 2021-02-08 | Discharge: 2021-02-08 | Disposition: A | Discharge status: Home / Self Care | Payer: Medicaid Other | Source: Ambulatory Visit | Attending: Family Medicine | Admitting: Family Medicine

## 2021-02-08 DIAGNOSIS — M79605 Pain in left leg: Secondary | ICD-10-CM

## 2021-02-08 DIAGNOSIS — R202 Paresthesia of skin: Secondary | ICD-10-CM

## 2021-02-08 DIAGNOSIS — M1990 Unspecified osteoarthritis, unspecified site: Secondary | ICD-10-CM

## 2021-02-08 DIAGNOSIS — G90513 Complex regional pain syndrome I of upper limb, bilateral: Secondary | ICD-10-CM

## 2021-02-08 MED ORDER — GADOBENATE DIMEGLUMINE 529 MG/ML IV SOLN
12.0000 mL | Freq: Once | INTRAVENOUS | Status: AC | PRN
  Administered 2021-02-08: 12 mL via INTRAVENOUS

## 2021-02-10 ENCOUNTER — Other Ambulatory Visit: Payer: Self-pay

## 2021-02-10 ENCOUNTER — Ambulatory Visit
Admission: RE | Admit: 2021-02-10 | Discharge: 2021-02-10 | Disposition: A | Discharge status: Home / Self Care | Payer: Medicare Other | Source: Ambulatory Visit | Attending: Family Medicine | Admitting: Family Medicine

## 2021-02-10 DIAGNOSIS — M79605 Pain in left leg: Secondary | ICD-10-CM

## 2021-02-10 DIAGNOSIS — G90513 Complex regional pain syndrome I of upper limb, bilateral: Secondary | ICD-10-CM

## 2021-02-10 MED ORDER — GADOBENATE DIMEGLUMINE 529 MG/ML IV SOLN
11.0000 mL | Freq: Once | INTRAVENOUS | Status: AC | PRN
  Administered 2021-02-10: 11 mL via INTRAVENOUS

## 2021-03-22 ENCOUNTER — Ambulatory Visit
Admission: EM | Admit: 2021-03-22 | Discharge: 2021-03-22 | Disposition: A | Discharge status: Home / Self Care | Payer: Medicare Other | Attending: Urgent Care | Admitting: Urgent Care

## 2021-03-22 ENCOUNTER — Other Ambulatory Visit: Payer: Self-pay

## 2021-03-22 DIAGNOSIS — L03213 Periorbital cellulitis: Secondary | ICD-10-CM

## 2021-03-22 MED ORDER — CEFDINIR 300 MG PO CAPS: 300.0000 mg | ORAL_CAPSULE | Freq: Two times a day (BID) | ORAL | 0 refills | Status: AC

## 2021-03-22 NOTE — ED Provider Notes (Signed)
[place]   MRN: [id number] DOB: 07/06/1955  Subjective:   Carmen Deleon is a 66 y.o. female presenting for 5-day history of cute onset persistent left upper eyelid swelling with redness.  She initially saw signs of a white spot but is no longer there.  Has continued to hurt her.  She is used multiple over-the-counter eyedrops without any relief.  Feels like her vision is intermittently blurred.  No matting of her eyelids, fevers, drainage of pus or bleeding, eye trauma, contact lens use.  No current facility-administered medications for this encounter.  Current Outpatient Medications:    acyclovir (ZOVIRAX) 400 MG tablet, Take 400 mg by mouth daily. At Lunch, Disp: , Rfl:    aspirin EC 81 MG tablet, Take 81 mg by mouth daily., Disp: , Rfl:    baclofen (LIORESAL) 10 MG tablet, Take 10 mg by mouth 2 (two) times daily. , Disp: , Rfl:    Calcium Carbonate-Vit D-Min (CALTRATE 600+D PLUS MINERALS) 600-800 MG-UNIT CHEW, Chew 2 each by mouth every evening., Disp: , Rfl:    diazepam (VALIUM) 2 MG tablet, Take 2 mg by mouth every 6 (six) hours. , Disp: , Rfl:    esterified estrogens (MENEST) 0.625 MG tablet, Take 0.975 mg by mouth at bedtime. , Disp: , Rfl:    magnesium hydroxide (MILK OF MAGNESIA) 400 MG/5ML suspension, Take 30 mLs by mouth daily as needed for mild constipation or moderate constipation. , Disp: , Rfl:    omeprazole (PRILOSEC) 40 MG capsule, Take 40 mg by mouth See admin instructions. Takes 1 cap daily at lunch, then takes 1 cap as needed for acid reflux, Disp: , Rfl:    ondansetron (ZOFRAN) 4 MG tablet, Take 4 mg by mouth every 8 (eight) hours as needed for nausea or vomiting., Disp: , Rfl:    pregabalin (LYRICA) 75 MG capsule, Take 1 capsule (75 mg total) by mouth 3 (three) times daily. (Patient taking differently: Take 50 mg by mouth 3 (three) times daily. ), Disp: 90 capsule, Rfl: 1   promethazine (PHENERGAN) 12.5 MG suppository, Place 12.5 mg rectally every 6  (six) hours as needed for nausea or vomiting., Disp: , Rfl:    rosuvastatin (CRESTOR) 5 MG tablet, Take 5 mg by mouth daily., Disp: , Rfl:    triamcinolone (KENALOG) 0.025 % cream, Apply 1 application topically daily as needed (for rash). , Disp: , Rfl:    Allergies  Allergen Reactions   Dilaudid [Hydromorphone Hcl] Anaphylaxis, Nausea And Vomiting and Other (See Comments)    N&V, passed out, caused gastroparesis Morphine is ok   Codeine Nausea And Vomiting   Darvon [Propoxyphene] Nausea And Vomiting   Toradol [Ketorolac Tromethamine] Nausea And Vomiting    TABLETS CAUSES N/V IV FORM IS OK   Tramadol Nausea And Vomiting   Erythromycin Nausea And Vomiting and Rash   Other Rash    chlorhexadine     Past Medical History:  Diagnosis Date   AC (acromioclavicular) joint bone spurs    Anxiety    Arthritis    Chest pain syndrome    Decreased ROM of neck    Depression    Difficult intubation 2006   with first endoscopy; did ok with endoscopy 10/15   Dysphagia    cannot swallow large pills, bread etc   Family history of adverse reaction to anesthesia    mother -PONV   Fibromyalgia    Gastroparesis    GERD (gastroesophageal reflux disease)    Headache  Heart murmur    echo 04/26/13: NL LVF, EF 84%, NL wall motion, mild PR, mild-mod TR, mild pulm HTN, mod MR, mild-mod AR (Dr. 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,THORACIC SPINE   Hyperlipidemia    IBS (irritable bowel syndrome)    Migraine    ocular    Mitral valve disorder    Motion sickness    boats   Neuromuscular disorder ([place])    no diagnosis   Palpitations    Paresthesia    Pinched nerve in neck    PONV (postoperative nausea and vomiting)    Shortness of breath    exertion     Past Surgical History:  Procedure Laterality Date   ABDOMINAL HYSTERECTOMY     ANTERIOR CERVICAL DECOMP/DISCECTOMY FUSION N/A 12/12/2013   Procedure: ANTERIOR CERVICAL  DECOMPRESSION/DISCECTOMY FUSION 2 LEVELS;  Surgeon: Kristeen Miss, MD;  Location: [facility];  Service: Neurosurgery;  Laterality: N/A;  C5-6 C6-7 Anterior cervical decompression/diskectomy/fusion   CARDIOVASCULAR STRESS TEST     05/03/13 ([facility]): probable NL, but can't r/o mild apical ischemia, EF 82%-->Cardiac CTA 05/20/13: Calcium score 8.9, right dominant system, normal coronaries   COLONOSCOPY WITH ESOPHAGOGASTRODUODENOSCOPY (EGD)     ECTOPIC PREGNANCY SURGERY     SHOULDER ARTHROSCOPY WITH ROTATOR CUFF REPAIR AND SUBACROMIAL DECOMPRESSION Left 10/16/2014   Procedure: SHOULDER ARTHROSCOPY WITH ROTATOR CUFF REPAIR AND SUBACROMIAL DECOMPRESSION;  Surgeon: Leanor Kail, MD;  Location: [facility] ORS;  Service: Orthopedics;  Laterality: Left;    Family History  Problem Relation Age of Onset   Stroke Mother    Heart disease Mother    Cancer Mother        lung   Hypertension Mother    Arthritis Mother    Depression Mother    Cancer Father    Arthritis Father    Depression Father    Breast cancer Paternal Aunt     Social History   Tobacco Use   Smoking status: Former    Types: Cigarettes    Quit date: 03/03/1976    Years since quitting: 45.0   Smokeless tobacco: Never  Vaping Use   Vaping Use: Never used  Substance Use Topics   Alcohol use: No   Drug use: No    ROS   Objective:   Vitals: BP 125/65    Pulse 74    Temp 98 F (36.7 C)    Resp 19    SpO2 96%   Physical Exam Constitutional:      General: She is not in acute distress.    Appearance: Normal appearance. She is well-developed. She is not ill-appearing, toxic-appearing or diaphoretic.  HENT:     Head: Normocephalic and atraumatic.     Nose: Nose normal.     Mouth/Throat:     Mouth: Mucous membranes are moist.  Eyes:     General: Lids are everted, no foreign bodies appreciated. No visual field deficit or scleral icterus.       Right eye: No foreign body, discharge or hordeolum.        Left eye: No  foreign body, discharge or hordeolum.     Extraocular Movements: Extraocular movements intact.     Right eye: Normal extraocular motion and no nystagmus.     Left eye: Normal extraocular motion and no nystagmus.     Conjunctiva/sclera:     Right eye: Right conjunctiva is not injected. No chemosis, exudate or hemorrhage.  Left eye: Left conjunctiva is not injected. No chemosis, exudate or hemorrhage.  Cardiovascular:     Rate and Rhythm: Normal rate.  Pulmonary:     Effort: Pulmonary effort is normal.  Skin:    General: Skin is warm and dry.  Neurological:     General: No focal deficit present.     Mental Status: She is alert and oriented to person, place, and time.  Psychiatric:        Mood and Affect: Mood normal.        Behavior: Behavior normal.    Assessment and Plan :   PDMP not reviewed this encounter.  1. Preseptal cellulitis of left upper eyelid    Recommended managing for preseptal cellulitis with cefdinir.  Use supportive care otherwise. Counseled patient on potential for adverse effects with medications prescribed/recommended today, ER and return-to-clinic precautions discussed, patient verbalized understanding.    Jaynee Eagles, PA-C 03/22/21 1155

## 2021-03-22 NOTE — ED Triage Notes (Addendum)
Pt presents with complaints of left eyelid redness and swelling. Denies any eye redness or drainage. Symptoms since Monday. Pt has used otc eye drops with no relief.

## 2021-08-13 ENCOUNTER — Other Ambulatory Visit: Payer: Self-pay | Admitting: Family Medicine

## 2021-08-14 ENCOUNTER — Other Ambulatory Visit: Payer: Self-pay | Admitting: Family Medicine

## 2021-08-14 DIAGNOSIS — Z1231 Encounter for screening mammogram for malignant neoplasm of breast: Secondary | ICD-10-CM

## 2021-08-15 ENCOUNTER — Other Ambulatory Visit: Payer: Self-pay | Admitting: Family Medicine

## 2021-08-15 DIAGNOSIS — N63 Unspecified lump in unspecified breast: Secondary | ICD-10-CM

## 2021-08-15 DIAGNOSIS — Z1231 Encounter for screening mammogram for malignant neoplasm of breast: Secondary | ICD-10-CM

## 2021-08-30 ENCOUNTER — Ambulatory Visit
Admission: RE | Admit: 2021-08-30 | Discharge: 2021-08-30 | Disposition: A | Discharge status: Home / Self Care | Payer: Medicare Other | Source: Ambulatory Visit | Attending: Family Medicine | Admitting: Family Medicine

## 2021-08-30 DIAGNOSIS — N63 Unspecified lump in unspecified breast: Secondary | ICD-10-CM | POA: Insufficient documentation

## 2021-08-30 DIAGNOSIS — N6311 Unspecified lump in the right breast, upper outer quadrant: Secondary | ICD-10-CM | POA: Diagnosis not present

## 2021-08-30 DIAGNOSIS — N6331 Unspecified lump in axillary tail of the right breast: Secondary | ICD-10-CM | POA: Insufficient documentation

## 2021-08-30 DIAGNOSIS — Z1231 Encounter for screening mammogram for malignant neoplasm of breast: Secondary | ICD-10-CM | POA: Insufficient documentation

## 2021-09-30 ENCOUNTER — Ambulatory Visit
Admission: EM | Admit: 2021-09-30 | Discharge: 2021-09-30 | Disposition: A | Discharge status: Home / Self Care | Payer: Medicare Other | Attending: Nurse Practitioner | Admitting: Nurse Practitioner

## 2021-09-30 DIAGNOSIS — J069 Acute upper respiratory infection, unspecified: Secondary | ICD-10-CM

## 2021-09-30 MED ORDER — PREDNISONE 20 MG PO TABS: 40.0000 mg | ORAL_TABLET | Freq: Every day | ORAL | 0 refills | Status: AC

## 2021-09-30 MED ORDER — CETIRIZINE HCL 10 MG PO TABS: 10.0000 mg | ORAL_TABLET | Freq: Every day | ORAL | 0 refills | Status: AC

## 2021-09-30 MED ORDER — PROMETHAZINE-DM 6.25-15 MG/5ML PO SYRP: 5.0000 mL | ORAL_SOLUTION | Freq: Four times a day (QID) | ORAL | 0 refills | Status: AC | PRN

## 2021-09-30 MED ORDER — FLUTICASONE PROPIONATE 50 MCG/ACT NA SUSP: 2.0000 | Freq: Every day | NASAL | 0 refills | Status: AC

## 2021-09-30 MED ORDER — AMOXICILLIN-POT CLAVULANATE 875-125 MG PO TABS: 1.0000 | ORAL_TABLET | Freq: Two times a day (BID) | ORAL | 0 refills | Status: AC

## 2021-09-30 NOTE — ED Provider Notes (Signed)
[facility]    CSN: [id number] Arrival date & time: 09/30/21  1526      History   Chief Complaint Chief Complaint  Patient presents with   Cough    HPI Carmen Deleon is a 66 y.o. female.   The history is provided by the patient.   Patient presents for complaints of cough, nasal congestion, postnasal drainage, and chest congestion has been present for the past 4 days.  Patient states her cough worsened over the past 72 hours.  She denies fever, but complains of chills, headache, wheezing, shortness of breath, difficulty breathing, or GI symptoms.  Patient states she has been taking NyQuil for her symptoms.  Patient is concerned that she has bronchitis as she has had this several times in the past.  She denies history of smoking.  She states that she was told by her pulmonologist that she had asthma and was given an albuterol inhaler in the past.  Past Medical History:  Diagnosis Date   AC (acromioclavicular) joint bone spurs    Anxiety    Arthritis    Chest pain syndrome    Decreased ROM of neck    Depression    Difficult intubation 2006   with first endoscopy; did ok with endoscopy 10/15   Dysphagia    cannot swallow large pills, bread etc   Family history of adverse reaction to anesthesia    mother -PONV   Fibromyalgia    Gastroparesis    GERD (gastroesophageal reflux disease)    Headache    Heart murmur    echo 04/26/13: NL LVF, EF 84%, NL wall motion, mild PR, mild-mod TR, mild pulm HTN, mod MR, mild-mod AR (Dr. 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,THORACIC SPINE   Hyperlipidemia    IBS (irritable bowel syndrome)    Migraine    ocular    Mitral valve disorder    Motion sickness    boats   Neuromuscular disorder ([place])    no diagnosis   Palpitations    Paresthesia    Pinched nerve in neck    PONV (postoperative nausea and vomiting)    Shortness of breath    exertion    Patient Active  Problem List   Diagnosis Date Noted   Postoperative nausea and vomiting 12/13/2013   Cervical spondylosis with myelopathy and radiculopathy 12/12/2013   Medial epicondylitis of left elbow 04/20/2012   Medial epicondylitis of right elbow 04/20/2012   Fibromyalgia 04/20/2012   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   Neuropathic pain 04/20/2012   Chronic left hip pain 02/11/2012   Left medial knee pain 02/11/2012   Right shoulder pain 10/13/2011   Cervicalgia 09/26/2011   Hip bursitis, left 09/26/2011   Lumbago 09/26/2011    Past Surgical History:  Procedure Laterality Date   ABDOMINAL HYSTERECTOMY     ANTERIOR CERVICAL DECOMP/DISCECTOMY FUSION N/A 12/12/2013   Procedure: ANTERIOR CERVICAL DECOMPRESSION/DISCECTOMY FUSION 2 LEVELS;  Surgeon: Kristeen Miss, MD;  Location: [facility];  Service: Neurosurgery;  Laterality: N/A;  C5-6 C6-7 Anterior cervical decompression/diskectomy/fusion   CARDIOVASCULAR STRESS TEST     05/03/13 ([facility]): probable NL, but can't r/o mild apical ischemia, EF 82%-->Cardiac CTA 05/20/13: Calcium score 8.9, right dominant system, normal coronaries   COLONOSCOPY WITH ESOPHAGOGASTRODUODENOSCOPY (EGD)     ECTOPIC PREGNANCY SURGERY     SHOULDER ARTHROSCOPY WITH ROTATOR CUFF REPAIR AND SUBACROMIAL DECOMPRESSION Left 10/16/2014   Procedure:  SHOULDER ARTHROSCOPY WITH ROTATOR CUFF REPAIR AND SUBACROMIAL DECOMPRESSION;  Surgeon: Leanor Kail, MD;  Location: [facility];  Service: Orthopedics;  Laterality: Left;    OB History   No obstetric history on file.      Home Medications    Prior to Admission medications   Medication Sig Start Date End Date Taking? Authorizing Provider  amoxicillin-clavulanate (AUGMENTIN) 875-125 MG tablet Take 1 tablet by mouth every 12 (twelve) hours. 10/04/21  Yes Nalla Purdy-Warren, Alda Lea, NP  cetirizine (ZYRTEC) 10 MG tablet Take 1 tablet (10 mg total) by mouth daily. 09/30/21  Yes Lyndol Vanderheiden-Warren, Alda Lea, NP  fluticasone  (FLONASE) 50 MCG/ACT nasal spray Place 2 sprays into both nostrils daily. 09/30/21  Yes Ugochi Henzler-Warren, Alda Lea, NP  predniSONE (DELTASONE) 20 MG tablet Take 2 tablets (40 mg total) by mouth daily with breakfast for 5 days. 09/30/21 10/05/21 Yes Edeline Greening-Warren, Alda Lea, NP  promethazine-dextromethorphan (PROMETHAZINE-DM) 6.25-15 MG/5ML syrup Take 5 mLs by mouth 4 (four) times daily as needed for cough. 09/30/21  Yes Anabelle Bungert-Warren, Alda Lea, NP  acyclovir (ZOVIRAX) 400 MG tablet Take 400 mg by mouth daily. At [facility] 07/14/11   [provider]  aspirin EC 81 MG tablet Take 81 mg by mouth daily.    [provider]  baclofen (LIORESAL) 10 MG tablet Take 10 mg by mouth 2 (two) times daily.     [provider]  Calcium Carbonate-Vit D-Min (CALTRATE 600+D PLUS MINERALS) 600-800 MG-UNIT CHEW Chew 2 each by mouth every evening.    [provider]  cefdinir (OMNICEF) 300 MG capsule Take 1 capsule (300 mg total) by mouth 2 (two) times daily. 03/22/21   Jaynee Eagles, PA-C  diazepam (VALIUM) 2 MG tablet Take 2 mg by mouth every 6 (six) hours.  07/14/11   [provider]  esterified estrogens (MENEST) 0.625 MG tablet Take 0.975 mg by mouth at bedtime.     [provider]  magnesium hydroxide (MILK OF MAGNESIA) 400 MG/5ML suspension Take 30 mLs by mouth daily as needed for mild constipation or moderate constipation.     [provider]  omeprazole (PRILOSEC) 40 MG capsule Take 40 mg by mouth See admin instructions. Takes 1 cap daily at lunch, then takes 1 cap as needed for acid reflux 07/15/11   [provider]  ondansetron (ZOFRAN) 4 MG tablet Take 4 mg by mouth every 8 (eight) hours as needed for nausea or vomiting.    [provider]  pregabalin (LYRICA) 75 MG capsule Take 1 capsule (75 mg total) by mouth 3 (three) times daily. Patient taking differently: Take 50 mg by mouth 3 (three) times daily.  06/19/14   Kirsteins, Luanna Salk, MD   promethazine (PHENERGAN) 12.5 MG suppository Place 12.5 mg rectally every 6 (six) hours as needed for nausea or vomiting.    [provider]  rosuvastatin (CRESTOR) 5 MG tablet Take 5 mg by mouth daily.    [provider]  triamcinolone (KENALOG) 0.025 % cream Apply 1 application topically daily as needed (for rash).     [provider]    Family History Family History  Problem Relation Age of Onset   Stroke Mother    Heart disease Mother    Cancer Mother        lung   Hypertension Mother    Arthritis Mother    Depression Mother    Cancer Father    Arthritis Father    Depression Father    Breast cancer Paternal Aunt  Social History Social History   Tobacco Use   Smoking status: Former    Types: Cigarettes    Quit date: 03/03/1976    Years since quitting: 45.6   Smokeless tobacco: Never  Vaping Use   Vaping Use: Never used  Substance Use Topics   Alcohol use: No   Drug use: No     Allergies   Dilaudid [hydromorphone hcl], Codeine, Propoxyphene, Toradol [ketorolac tromethamine], Tramadol, Chlorhexidine, Erythromycin base, Premarin [conjugated estrogens], Erythromycin, and Other   Review of Systems Review of Systems Per HPI  Physical Exam Triage Vital Signs ED Triage Vitals  Enc Vitals Group     BP 09/30/21 1546 (!) 154/80     Pulse Rate 09/30/21 1546 93     Resp 09/30/21 1546 18     Temp 09/30/21 1546 98.4 F (36.9 C)     Temp Source 09/30/21 1546 Oral     SpO2 09/30/21 1546 98 %     Weight --      Height --      Head Circumference --      Peak Flow --      Pain Score 09/30/21 1544 0     Pain Loc --      Pain Edu? --      Excl. in [place]? --    No data found.  Updated Vital Signs BP (!) 154/80 (BP Location: Right Arm)   Pulse 93   Temp 98.4 F (36.9 C) (Oral)   Resp 18   SpO2 98%   Visual Acuity Right Eye Distance:   Left Eye Distance:   Bilateral Distance:    Right Eye Near:   Left Eye Near:    Bilateral  Near:     Physical Exam Vitals reviewed.  Constitutional:      General: She is not in acute distress.    Appearance: Normal appearance. She is well-developed.  HENT:     Head: Normocephalic and atraumatic.     Right Ear: A middle ear effusion is present.     Left Ear: Tympanic membrane, ear canal and external ear normal.     Nose: Congestion present. No rhinorrhea.     Right Turbinates: Enlarged and swollen.     Left Turbinates: Enlarged and swollen.     Right Sinus: No maxillary sinus tenderness or frontal sinus tenderness.     Left Sinus: No maxillary sinus tenderness or frontal sinus tenderness.     Mouth/Throat:     Lips: Pink.     Mouth: Mucous membranes are moist.     Pharynx: Oropharynx is clear. Uvula midline. Posterior oropharyngeal erythema present. No pharyngeal swelling, oropharyngeal exudate or uvula swelling.     Tonsils: No tonsillar exudate.  Eyes:     Extraocular Movements: Extraocular movements intact.     Conjunctiva/sclera: Conjunctivae normal.     Pupils: Pupils are equal, round, and reactive to light.  Neck:     Thyroid: No thyromegaly.     Trachea: No tracheal deviation.  Cardiovascular:     Rate and Rhythm: Normal rate and regular rhythm.     Heart sounds: Normal heart sounds.  Pulmonary:     Effort: Pulmonary effort is normal.     Breath sounds: Normal breath sounds.  Abdominal:     General: Bowel sounds are normal. There is no distension.     Palpations: Abdomen is soft.     Tenderness: There is no abdominal tenderness.  Musculoskeletal:     Cervical back: Normal  range of motion and neck supple.  Skin:    General: Skin is warm and dry.  Neurological:     General: No focal deficit present.     Mental Status: She is alert and oriented to person, place, and time.  Psychiatric:        Mood and Affect: Mood normal.        Behavior: Behavior normal.        Thought Content: Thought content normal.        Judgment: Judgment normal.      UC  Treatments / Results  Labs (all labs ordered are listed, but only abnormal results are displayed) Labs Reviewed - No data to display  EKG   Radiology No results found.  Procedures Procedures (including critical care time)  Medications Ordered in UC Medications - No data to display  Initial Impression / Assessment and Plan / UC Course  I have reviewed the triage vital signs and the nursing notes.  Pertinent labs & imaging results that were available during my care of the patient were reviewed by me and considered in my medical decision making (see chart for details).  Patient presents for complaints of cough, nasal congestion, chest congestion postnasal drainage over the past 4 days.  On exam, patient's vital signs are stable, although she is mildly hypertensive.  Her exam is benign and reassuring.  Lung sounds are clear throughout.  Will defer COVID and flu testing at this time as patient has had symptoms for 4 days and would not change treatment course at this time.  Differential diagnoses include acute bronchitis, versus viral upper respiratory infection versus acute sinusitis.  We will treat patient symptomatically with Promethazine DM, prednisone, cetirizine,and fluticasone.  We will also approach patient's symptoms with a watch and wait strategy regarding antibiotic use.  Will prescribe antibiotics to be started on 10/04/2021 for worsening symptoms.  Patient advised to follow-up with her primary care physician if symptoms do not improve. Final Clinical Impressions(s) / UC Diagnoses   Final diagnoses:  Viral upper respiratory tract infection with cough     Discharge Instructions      Take medication as prescribed. Increase fluids and allow for plenty of rest. Recommend Tylenol or ibuprofen as needed for pain, fever, or general discomfort. Warm salt water gargles 3-4 times daily to help with throat pain or discomfort. As discussed, if your symptoms do not improve by 8/4//2023,  pick up your prescription for the antibiotic at your preferred pharmacy. Recommend using a humidifier at bedtime during sleep to help with cough and nasal congestion. Sleep elevated on 2 pillows while symptoms persist. Follow-up with your PCP if your symptoms do not improve.       ED Prescriptions     Medication Sig Dispense Auth. Provider   amoxicillin-clavulanate (AUGMENTIN) 875-125 MG tablet Take 1 tablet by mouth every 12 (twelve) hours. 14 tablet Taleeyah Bora-Warren, Alda Lea, NP   fluticasone (FLONASE) 50 MCG/ACT nasal spray Place 2 sprays into both nostrils daily. 16 g Daphene Chisholm-Warren, Alda Lea, NP   cetirizine (ZYRTEC) 10 MG tablet Take 1 tablet (10 mg total) by mouth daily. 30 tablet Marion Seese-Warren, Alda Lea, NP   promethazine-dextromethorphan (PROMETHAZINE-DM) 6.25-15 MG/5ML syrup Take 5 mLs by mouth 4 (four) times daily as needed for cough. 118 mL Matt Delpizzo-Warren, Alda Lea, NP   predniSONE (DELTASONE) 20 MG tablet Take 2 tablets (40 mg total) by mouth daily with breakfast for 5 days. 10 tablet Jacobe Study-Warren, Alda Lea, NP  PDMP not reviewed this encounter.   Tish Men, NP 09/30/21 [phone]

## 2021-09-30 NOTE — ED Triage Notes (Signed)
Pt reports cough and chest congestion x 4 days.

## 2021-09-30 NOTE — Discharge Instructions (Addendum)
Take medication as prescribed. Increase fluids and allow for plenty of rest. Recommend Tylenol or ibuprofen as needed for pain, fever, or general discomfort. Warm salt water gargles 3-4 times daily to help with throat pain or discomfort. As discussed, if your symptoms do not improve by 8/4//2023, pick up your prescription for the antibiotic at your preferred pharmacy. Recommend using a humidifier at bedtime during sleep to help with cough and nasal congestion. Sleep elevated on 2 pillows while symptoms persist. Follow-up with your PCP if your symptoms do not improve.

## 2022-03-13 ENCOUNTER — Ambulatory Visit (INDEPENDENT_AMBULATORY_CARE_PROVIDER_SITE_OTHER): Payer: Medicare Other | Admitting: Dermatology

## 2022-03-13 DIAGNOSIS — L578 Other skin changes due to chronic exposure to nonionizing radiation: Secondary | ICD-10-CM | POA: Diagnosis not present

## 2022-03-13 DIAGNOSIS — L814 Other melanin hyperpigmentation: Secondary | ICD-10-CM

## 2022-03-13 DIAGNOSIS — Z1283 Encounter for screening for malignant neoplasm of skin: Secondary | ICD-10-CM | POA: Diagnosis not present

## 2022-03-13 DIAGNOSIS — D1801 Hemangioma of skin and subcutaneous tissue: Secondary | ICD-10-CM

## 2022-03-13 DIAGNOSIS — L650 Telogen effluvium: Secondary | ICD-10-CM | POA: Diagnosis not present

## 2022-03-13 DIAGNOSIS — Z79899 Other long term (current) drug therapy: Secondary | ICD-10-CM

## 2022-03-13 DIAGNOSIS — D229 Melanocytic nevi, unspecified: Secondary | ICD-10-CM

## 2022-03-13 DIAGNOSIS — L719 Rosacea, unspecified: Secondary | ICD-10-CM | POA: Diagnosis not present

## 2022-03-13 DIAGNOSIS — L821 Other seborrheic keratosis: Secondary | ICD-10-CM

## 2022-03-13 MED ORDER — METRONIDAZOLE 0.75 % EX CREA: TOPICAL_CREAM | CUTANEOUS | 3 refills | Status: AC

## 2022-03-13 MED ORDER — IVERMECTIN 1 % EX CREA: 1.0000 "application " | TOPICAL_CREAM | CUTANEOUS | 3 refills | Status: AC

## 2022-03-13 MED ORDER — AZELAIC ACID 15 % EX GEL: 1.0000 "application " | CUTANEOUS | 3 refills | Status: AC

## 2022-03-13 NOTE — Progress Notes (Signed)
New Patient Visit  Subjective  Carmen Deleon is a 67 y.o. female who presents for the following: Other (New patient - No history of skin cancer. The patient presents for Total-Body Skin Exam (TBSE) for skin cancer screening and mole check.  The patient has spots, moles and lesions to be evaluated, some may be new or changing and the patient has concerns that these could be cancer.//).  The following portions of the chart were reviewed this encounter and updated as appropriate:   Tobacco  Allergies  Meds  Problems  Med Hx  Surg Hx  Fam Hx     Review of Systems:  No other skin or systemic complaints except as noted in HPI or Assessment and Plan.  Objective  Well appearing patient in no apparent distress; mood and affect are within normal limits.  A full examination was performed including scalp, head, eyes, ears, nose, lips, neck, chest, axillae, abdomen, back, buttocks, bilateral upper extremities, bilateral lower extremities, hands, feet, fingers, toes, fingernails, and toenails. All findings within normal limits unless otherwise noted below.  Face Erythema and dilated blood vessels  Scalp Diffuse thinning of hair, positive hair pull test.    Assessment & Plan   Lentigines - Scattered tan macules - Due to sun exposure - Benign-appearing, observe - Recommend daily broad spectrum sunscreen SPF 30+ to sun-exposed areas, reapply every 2 hours as needed. - Call for any changes  Seborrheic Keratoses - Stuck-on, waxy, tan-brown papules and/or plaques  - Benign-appearing - Discussed benign etiology and prognosis. - Observe - Call for any changes  Melanocytic Nevi - Tan-brown and/or pink-flesh-colored symmetric macules and papules - Benign appearing on exam today - Observation - Call clinic for new or changing moles - Recommend daily use of broad spectrum spf 30+ sunscreen to sun-exposed areas.   Hemangiomas - Red papules - Discussed benign nature - Observe - Call  for any changes  Actinic Damage - Chronic condition, secondary to cumulative UV/sun exposure - diffuse scaly erythematous macules with underlying dyspigmentation - Recommend daily broad spectrum sunscreen SPF 30+ to sun-exposed areas, reapply every 2 hours as needed.  - Staying in the shade or wearing long sleeves, sun glasses (UVA+UVB protection) and wide brim hats (4-inch brim around the entire circumference of the hat) are also recommended for sun protection.  - Call for new or changing lesions.  Skin cancer screening performed today.  Rosacea Face Rosacea is a chronic progressive skin condition usually affecting the face of adults, causing redness and/or acne bumps. It is treatable but not curable. It sometimes affects the eyes (ocular rosacea) as well. It may respond to topical and/or systemic medication and can flare with stress, sun exposure, alcohol, exercise, topical steroids (including hydrocortisone/cortisone 10) and some foods.  Daily application of broad spectrum spf 30+ sunscreen to face is recommended to reduce flares.  Metrocream qd-bid, Finacea gel qd-bid, Soolantra gel qd-bid  metroNIDAZOLE (METROCREAM) 0.75 % cream - Face Apply topically as directed. Qd-bid Ivermectin (SOOLANTRA) 1 % CREA - Face Apply 1 application  topically as directed. Qd-bid Azelaic Acid 15 % gel - Face Apply 1 application  topically as directed. Qd-bid  Telogen effluvium Scalp Telogen Effluvium Counseling Telogen effluvium is a benign, self-limited condition causing increased hair shedding usually for several months. It does not progress to baldness, and the hair eventually grows back on its own. It can be triggered by recent illness, recent surgery, thyroid disease, low iron stores, vitamin D deficiency, fad diets or rapid weight  loss, hormonal changes such as pregnancy or birth control pills, and some medication. Usually the hair loss starts 2-3 months after the illness or health change. Rarely,  it can continue for longer than a year. Treatments options may include oral or topical Minoxidil; Red Light scalp treatments; Biotin 2.5 mg daily and other options.   Will request labs from her PCP and review on her follow up. Recommend starting Biotin 2.5 mg daily  Return in about 3 months (around 06/12/2022).  I, Ashok Cordia, CMA, am acting as scribe for Sarina Ser, MD . Documentation: I have reviewed the above documentation for accuracy and completeness, and I agree with the above.  Sarina Ser, MD

## 2022-03-13 NOTE — Patient Instructions (Addendum)
Rosacea is a chronic progressive skin condition usually affecting the face of adults, causing redness and/or acne bumps. It is treatable but not curable. It sometimes affects the eyes (ocular rosacea) as well. It may respond to topical and/or systemic medication and can flare with stress, sun exposure, alcohol, exercise, topical steroids (including hydrocortisone/cortisone 10) and some foods.  Daily application of broad spectrum spf 30+ sunscreen to face is recommended to reduce flares.    Telogen Effluvium Counseling Telogen effluvium is a benign, self-limited condition causing increased hair shedding usually for several months. It does not progress to baldness, and the hair eventually grows back on its own. It can be triggered by recent illness, recent surgery, thyroid disease, low iron stores, vitamin D deficiency, fad diets or rapid weight loss, hormonal changes such as pregnancy or birth control pills, and some medication. Usually the hair loss starts 2-3 months after the illness or health change. Rarely, it can continue for longer than a year. Treatments options may include oral or topical Minoxidil; Red Light scalp treatments; Biotin 2.5 mg daily and other options.  Recommend starting Biotin 2.5 mg 1 pill daily  Due to recent changes in healthcare laws, you may see results of your pathology and/or laboratory studies on MyChart before the doctors have had a chance to review them. We understand that in some cases there may be results that are confusing or concerning to you. Please understand that not all results are received at the same time and often the doctors may need to interpret multiple results in order to provide you with the best plan of care or course of treatment. Therefore, we ask that you please give [place] 2 business days to thoroughly review all your results before contacting the office for clarification. Should we see a critical lab result, you will be contacted sooner.   If You Need  Anything After Your Visit  If you have any questions or concerns for your doctor, please call our main line at [phone] and press option 4 to reach your doctor's medical assistant. If no one answers, please leave a voicemail as directed and we will return your call as soon as possible. Messages left after 4 pm will be answered the following business day.   You may also send [place] a message via [place]. We typically respond to MyChart messages within 1-2 business days.  For prescription refills, please ask your pharmacy to contact our office. Our fax number is [phone].  If you have an urgent issue when the clinic is closed that cannot wait until the next business day, you can page your doctor at the number below.    Please note that while we do our best to be available for urgent issues outside of office hours, we are not available 24/7.   If you have an urgent issue and are unable to reach [place], you may choose to seek medical care at your doctor's office, retail clinic, urgent care center, or emergency room.  If you have a medical emergency, please immediately call 911 or go to the emergency department.  Pager Numbers  - Dr. Nehemiah Massed: [phone]  - Dr. Laurence Ferrari: [phone]  - Dr. Nicole Kindred: [phone]  In the event of inclement weather, please call our main line at [phone] for an update on the status of any delays or closures.  Dermatology Medication Tips: Please keep the boxes that topical medications come in in order to help keep track of the instructions about where and how to use these. Pharmacies  typically print the medication instructions only on the boxes and not directly on the medication tubes.   If your medication is too expensive, please contact our office at [phone] option 4 or send [place] a message through [place].   We are unable to tell what your co-pay for medications will be in advance as this is different depending on your insurance coverage. However, we may  be able to find a substitute medication at lower cost or fill out paperwork to get insurance to cover a needed medication.   If a prior authorization is required to get your medication covered by your insurance company, please allow [place] 1-2 business days to complete this process.  Drug prices often vary depending on where the prescription is filled and some pharmacies may offer cheaper prices.  The website www.goodrx.com contains coupons for medications through different pharmacies. The prices here do not account for what the cost may be with help from insurance (it may be cheaper with your insurance), but the website can give you the price if you did not use any insurance.  - You can print the associated coupon and take it with your prescription to the pharmacy.  - You may also stop by our office during regular business hours and pick up a GoodRx coupon card.  - If you need your prescription sent electronically to a different pharmacy, notify our office through [facility] or by phone at [phone] option 4.     Si Usted Necesita Algo Despus de Su Visita  Tambin puede enviarnos un mensaje a travs de Pharmacist, community. Por lo general respondemos a los mensajes de MyChart en el transcurso de 1 a 2 das hbiles.  Para renovar recetas, por favor pida a su farmacia que se ponga en contacto con nuestra oficina. Harland Dingwall de fax es [place] [phone].  Si tiene un asunto urgente cuando la clnica est cerrada y que no puede esperar hasta el siguiente da hbil, puede llamar/localizar a su doctor(a) al nmero que aparece a continuacin.   Por favor, tenga en cuenta que aunque hacemos todo lo posible para estar disponibles para asuntos urgentes fuera del horario de [place], no estamos disponibles las 24 horas del da, los 7 das de la [place].   Si tiene un problema urgente y no puede comunicarse con nosotros, puede optar por buscar atencin mdica  en el consultorio de su doctor(a), en una  clnica privada, en un centro de atencin urgente o en una sala de emergencias.  Si tiene Engineering geologist, por favor llame inmediatamente al 911 o vaya a la sala de emergencias.  Nmeros de bper  - Dr. Nehemiah Massed: [phone]  - Dra. Moye: [phone]  - Dra. Nicole Kindred: [phone]  En caso de inclemencias del [place], por favor llame a Johnsie Kindred principal al [phone] para una actualizacin sobre el [place] de cualquier retraso o cierre.  Consejos para la medicacin en dermatologa: Por favor, guarde las cajas en las que vienen los medicamentos de uso tpico para ayudarle a seguir las instrucciones sobre dnde y cmo usarlos. Las farmacias generalmente imprimen las instrucciones del medicamento slo en las cajas y no directamente en los tubos del [place].   Si su medicamento es muy caro, por favor, pngase en contacto con Zigmund Daniel llamando al [phone] y presione la opcin 4 o envenos un mensaje a travs de Pharmacist, community.   No podemos decirle cul ser su copago por los medicamentos por adelantado ya que esto es diferente dependiendo de la [place] de  su seguro. Sin embargo, es posible que podamos encontrar un medicamento sustituto a Electrical engineer un formulario para que el seguro cubra el medicamento que se considera necesario.   Si se requiere una autorizacin previa para que su compaa de seguros [place] su medicamento, por favor permtanos de 1 a 2 das hbiles para completar este proceso.  Los precios de los medicamentos varan con frecuencia dependiendo del Environmental consultant de dnde se surte la receta y alguna farmacias pueden ofrecer precios ms baratos.  El sitio web www.goodrx.com tiene cupones para medicamentos de Airline pilot. Los precios aqu no tienen en cuenta lo que podra costar con la ayuda del seguro (puede ser ms barato con su seguro), pero el sitio web puede darle el precio si no utiliz Research scientist (physical sciences).  - Puede imprimir el cupn correspondiente y  llevarlo con su receta a la farmacia.  - Tambin puede pasar por nuestra oficina durante el horario de atencin regular y Charity fundraiser una tarjeta de cupones de GoodRx.  - Si necesita que su receta se enve electrnicamente a una farmacia diferente, informe a nuestra oficina a travs de MyChart de [place] o por telfono llamando al [phone] y presione la opcin 4.

## 2022-03-18 ENCOUNTER — Encounter: Payer: Self-pay | Admitting: Dermatology

## 2022-05-15 ENCOUNTER — Other Ambulatory Visit: Payer: Self-pay | Admitting: Neurological Surgery

## 2022-05-15 DIAGNOSIS — G950 Syringomyelia and syringobulbia: Secondary | ICD-10-CM

## 2022-06-03 ENCOUNTER — Other Ambulatory Visit: Payer: Self-pay | Admitting: Neurological Surgery

## 2022-06-03 DIAGNOSIS — R102 Pelvic and perineal pain: Secondary | ICD-10-CM

## 2022-06-07 ENCOUNTER — Ambulatory Visit
Admission: RE | Admit: 2022-06-07 | Discharge: 2022-06-07 | Disposition: A | Discharge status: Home / Self Care | Payer: 59 | Source: Ambulatory Visit | Attending: Neurological Surgery | Admitting: Neurological Surgery

## 2022-06-07 DIAGNOSIS — R102 Pelvic and perineal pain: Secondary | ICD-10-CM

## 2022-06-07 DIAGNOSIS — G950 Syringomyelia and syringobulbia: Secondary | ICD-10-CM

## 2022-06-07 MED ORDER — GADOPICLENOL 0.5 MMOL/ML IV SOLN
6.0000 mL | Freq: Once | INTRAVENOUS | Status: AC | PRN
  Administered 2022-06-07: 6 mL via INTRAVENOUS

## 2022-06-12 ENCOUNTER — Ambulatory Visit: Payer: 59 | Admitting: Dermatology

## 2022-12-17 ENCOUNTER — Other Ambulatory Visit: Payer: Self-pay | Admitting: Family Medicine

## 2022-12-17 DIAGNOSIS — Z1231 Encounter for screening mammogram for malignant neoplasm of breast: Secondary | ICD-10-CM

## 2023-02-03 ENCOUNTER — Other Ambulatory Visit: Payer: Self-pay | Admitting: Family Medicine

## 2023-02-03 DIAGNOSIS — Z780 Asymptomatic menopausal state: Secondary | ICD-10-CM

## 2023-03-31 ENCOUNTER — Ambulatory Visit
Admission: RE | Admit: 2023-03-31 | Discharge: 2023-03-31 | Disposition: A | Discharge status: Home / Self Care | Payer: 59 | Source: Ambulatory Visit | Attending: Family Medicine | Admitting: Family Medicine

## 2023-03-31 DIAGNOSIS — Z1231 Encounter for screening mammogram for malignant neoplasm of breast: Secondary | ICD-10-CM | POA: Diagnosis present

## 2023-03-31 DIAGNOSIS — Z780 Asymptomatic menopausal state: Secondary | ICD-10-CM | POA: Diagnosis present
